# Patient Record
Sex: Female | Born: 1990 | Race: Black or African American | Hispanic: No | Marital: Single | Smoking: Former smoker
Health system: Southern US, Community
[De-identification: ages and names within clinical notes are randomized; demographics above are authoritative.]

## PROBLEM LIST (undated history)

## (undated) DIAGNOSIS — I10 Essential (primary) hypertension: Secondary | ICD-10-CM

## (undated) DIAGNOSIS — J45909 Unspecified asthma, uncomplicated: Secondary | ICD-10-CM

## (undated) HISTORY — DX: Unspecified asthma, uncomplicated: J45.909

---

## 2011-12-15 DIAGNOSIS — I10 Essential (primary) hypertension: Secondary | ICD-10-CM

## 2018-09-06 ENCOUNTER — Other Ambulatory Visit: Payer: Self-pay

## 2018-09-06 ENCOUNTER — Encounter (HOSPITAL_COMMUNITY): Payer: Self-pay | Admitting: Emergency Medicine

## 2018-09-06 ENCOUNTER — Emergency Department (HOSPITAL_COMMUNITY)
Admission: EM | Admit: 2018-09-06 | Discharge: 2018-09-07 | Disposition: A | Discharge status: Home / Self Care | Payer: Self-pay | Attending: Emergency Medicine | Admitting: Emergency Medicine

## 2018-09-06 DIAGNOSIS — Z79899 Other long term (current) drug therapy: Secondary | ICD-10-CM | POA: Insufficient documentation

## 2018-09-06 DIAGNOSIS — F17210 Nicotine dependence, cigarettes, uncomplicated: Secondary | ICD-10-CM | POA: Insufficient documentation

## 2018-09-06 DIAGNOSIS — L282 Other prurigo: Secondary | ICD-10-CM | POA: Insufficient documentation

## 2018-09-06 NOTE — ED Notes (Signed)
Patient states that she is here for her rash under her left breast and is not complaining of chest pain now as she stated in triage.

## 2018-09-06 NOTE — ED Notes (Signed)
EKG completed at 22:05. EKG shown to [facility]

## 2018-09-06 NOTE — ED Triage Notes (Signed)
Patient states that she is having chest pain and numbness to her left arm. Patient states that this started today. Patient also complains of a rash on her left breast.

## 2018-09-06 NOTE — ED Provider Notes (Signed)
[facility]   CSN: [id number] Arrival date & time: 09/06/18  2139   Time seen 11:47 PM  History   Chief Complaint Chief Complaint  Patient presents with  . Chest Pain    numbness left arm    HPI Brandi Mason is a 28 y.o. female.     HPI patient states yesterday she started having some itching on her left breast and states the skin looked normal.  She states later in the day the skin came off while in the shower.  She complains of soreness now in her left breast that extends up into her left shoulder.  She states the area is still very pruritic.  She denies fever.  She states is draining green pus.  She attributed this to her bra however she states her bra is not new and she has not changed detergents.  She is never had this happen before.  During the course of our conversation she relates to me she was admitted in March and she was "prediabetic" however she was treated with insulin in the hospital.  She was discharged without medications and was told to follow-up of her primary care doctor which she has been unable to do due to COVID.  She states she was admitted to [facility] [place].  She denies polyuria but states she has extreme urgency.  She describes lots of polydipsia without weight loss and states actually she has been gaining weight.  She also states she had a miscarriage on August 16.  She states she is G4 P1 AB 3.  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  She describes family history of hypertension and diabetes  [place] of [place]  History reviewed. No pertinent past medical history.  HTN  There are no active problems to display for this patient.   History reviewed. No pertinent surgical history.   OB History   No obstetric history on file.      Home Medications   Labetalol x 5 years   Prior to Admission medications   Medication Sig Start Date End Date Taking? Authorizing Provider   cephALEXin (KEFLEX) 500 MG capsule Take 1 capsule (500 mg total) by mouth 3 (three) times daily. 09/07/18   Devoria AlbeKnapp, Lyrik Buresh, MD    Family History History reviewed. No pertinent family history.  Social History Social History   Tobacco Use  . Smoking status: Current Every Day Smoker    Packs/day: 0.50    Types: Cigarettes  . Smokeless tobacco: Never Used  Substance Use Topics  . Alcohol use: Not Currently  . Drug use: Never     Allergies   Patient has no allergy information on record.   Review of Systems Review of Systems  All other systems reviewed and are negative.    Physical Exam Updated Vital Signs BP (!) 145/76 (BP Location: Right Arm)   Pulse (!) 106   Temp 99.3 F (37.4 C) (Oral)   Resp 20   Ht 5\' 9"  (1.753 m)   Wt 122.5 kg   LMP  (LMP Unknown)   SpO2 99%   BMI 39.87 kg/m   Physical Exam Vitals signs and nursing note reviewed.  Constitutional:      Appearance: Normal appearance. She is obese.  HENT:     Head: Normocephalic and atraumatic.     Right Ear: External ear normal.     Left Ear: External ear normal.  Eyes:  Extraocular Movements: Extraocular movements intact.     Conjunctiva/sclera: Conjunctivae normal.  Neck:     Musculoskeletal: Normal range of motion.  Cardiovascular:     Rate and Rhythm: Normal rate.  Pulmonary:     Effort: Pulmonary effort is normal. No respiratory distress.  Chest:    Skin:    General: Skin is warm and dry.     Comments: When I examine her breasts she has an area in the left breast that is midline and superior and is linear and appears to be a very deep abrasion, she states it is draining pus however I do not see where it has been draining at all.  There is no induration under the skin.  There is minimal redness around it.  Neurological:     General: No focal deficit present.     Mental Status: She is alert and oriented to person, place, and time.     Cranial Nerves: No cranial nerve deficit.  Psychiatric:         Mood and Affect: Mood normal.        Behavior: Behavior normal.        Thought Content: Thought content normal.      ED Treatments / Results  Labs (all labs ordered are listed, but only abnormal results are displayed) Results for orders placed or performed during the hospital encounter of 09/06/18  Urinalysis, Routine w reflex microscopic  Result Value Ref Range   Color, Urine YELLOW YELLOW   APPearance HAZY (A) CLEAR   Specific Gravity, Urine 1.030 1.005 - 1.030   pH 5.0 5.0 - 8.0   Glucose, UA NEGATIVE NEGATIVE mg/dL   Hgb urine dipstick NEGATIVE NEGATIVE   Bilirubin Urine NEGATIVE NEGATIVE   Ketones, ur NEGATIVE NEGATIVE mg/dL   Protein, ur NEGATIVE NEGATIVE mg/dL   Nitrite NEGATIVE NEGATIVE   Leukocytes,Ua NEGATIVE NEGATIVE  CBG monitoring, ED  Result Value Ref Range   Glucose-Capillary 116 (H) 70 - 99 mg/dL   Laboratory interpretation all normal     EKG None  ED ECG REPORT   Date: 09/07/2018  Rate: 98  Rhythm: normal sinus rhythm  QRS Axis: normal  Intervals: normal  ST/T Wave abnormalities: early repolarization  Conduction Disutrbances:none  Narrative Interpretation: baseline wander  Old EKG Reviewed: none available  I have personally reviewed the EKG tracing and agree with the computerized printout as noted.   Radiology No results found.  Procedures Procedures (including critical care time)  Medications Ordered in ED Medications  famotidine (PEPCID) tablet 20 mg (20 mg Oral Given 09/07/18 0055)  diphenhydrAMINE (BENADRYL) capsule 50 mg (50 mg Oral Given 09/07/18 0056)     Initial Impression / Assessment and Plan / ED Course  I have reviewed the triage vital signs and the nursing notes.  Pertinent labs & imaging results that were available during my care of the patient were reviewed by me and considered in my medical decision making (see chart for details).      Concern was that patient was on insulin a several months ago when she  describes polydipsia.  CBG was done and she also complained of urinary urgency.  Urinalysis was done.  Patient blood sugar is normal and her UA does not show any sign of UTI.  She was advised of what she can take over-the-counter for the itching and she was started on Keflex.  She states the area is draining however I do not see any evidence of that.  I do not feel  anything that feels like an abscess in the area.  She should be rechecked if it gets worse.    Final Clinical Impressions(s) / ED Diagnoses   Final diagnoses:  Pruritic rash    ED Discharge Orders         Ordered    cephALEXin (KEFLEX) 500 MG capsule  3 times daily     09/07/18 0120         Plan discharge  Rolland Porter, MD, Barbette Or, MD 09/07/18 [phone]

## 2018-09-07 LAB — URINALYSIS, ROUTINE W REFLEX MICROSCOPIC
Bilirubin Urine: NEGATIVE
Glucose, UA: NEGATIVE mg/dL
Hgb urine dipstick: NEGATIVE
Ketones, ur: NEGATIVE mg/dL
Leukocytes,Ua: NEGATIVE
Nitrite: NEGATIVE
Protein, ur: NEGATIVE mg/dL
Specific Gravity, Urine: 1.03 (ref 1.005–1.030)
pH: 5 (ref 5.0–8.0)

## 2018-09-07 LAB — CBG MONITORING, ED: Glucose-Capillary: 116 mg/dL — ABNORMAL HIGH (ref 70–99)

## 2018-09-07 MED ORDER — DIPHENHYDRAMINE HCL 25 MG PO CAPS
50.0000 mg | ORAL_CAPSULE | Freq: Once | ORAL | Status: AC
  Administered 2018-09-07: 01:00:00 50 mg via ORAL
  Filled 2018-09-07: qty 2

## 2018-09-07 MED ORDER — FAMOTIDINE 20 MG PO TABS
20.0000 mg | ORAL_TABLET | Freq: Once | ORAL | Status: AC
  Administered 2018-09-07: 01:00:00 20 mg via ORAL
  Filled 2018-09-07: qty 1

## 2018-09-07 MED ORDER — CEPHALEXIN 500 MG PO CAPS: 500.0000 mg | ORAL_CAPSULE | Freq: Three times a day (TID) | ORAL | 0 refills | Status: DC

## 2018-09-07 NOTE — Discharge Instructions (Addendum)
Please wash the involved area at least once a day with soap and water.  Take the antibiotic until gone.  You can take Pepcid over-the-counter twice a day for the itching.  You can take Claritin over-the-counter once a day for itching.  Recheck if you get a fever, or the area gets more red or swollen.  Your blood sugar look great today at 116 which is a good blood sugar for someone who has been eating throughout the day.  Your urine did not show any signs of infection.

## 2019-08-26 ENCOUNTER — Encounter (HOSPITAL_COMMUNITY): Payer: Self-pay

## 2019-08-26 ENCOUNTER — Other Ambulatory Visit: Payer: Self-pay

## 2019-08-26 ENCOUNTER — Emergency Department (HOSPITAL_COMMUNITY)
Admission: EM | Admit: 2019-08-26 | Discharge: 2019-08-27 | Disposition: A | Discharge status: Home / Self Care | Payer: Medicaid - Out of State | Attending: Emergency Medicine | Admitting: Emergency Medicine

## 2019-08-26 DIAGNOSIS — Z87891 Personal history of nicotine dependence: Secondary | ICD-10-CM | POA: Insufficient documentation

## 2019-08-26 DIAGNOSIS — N760 Acute vaginitis: Secondary | ICD-10-CM | POA: Insufficient documentation

## 2019-08-26 DIAGNOSIS — I10 Essential (primary) hypertension: Secondary | ICD-10-CM | POA: Insufficient documentation

## 2019-08-26 DIAGNOSIS — B9689 Other specified bacterial agents as the cause of diseases classified elsewhere: Secondary | ICD-10-CM | POA: Diagnosis not present

## 2019-08-26 DIAGNOSIS — B373 Candidiasis of vulva and vagina: Secondary | ICD-10-CM

## 2019-08-26 DIAGNOSIS — B3731 Acute candidiasis of vulva and vagina: Secondary | ICD-10-CM

## 2019-08-26 HISTORY — DX: Essential (primary) hypertension: I10

## 2019-08-26 MED ORDER — FLUCONAZOLE 150 MG PO TABS
150.0000 mg | ORAL_TABLET | Freq: Once | ORAL | Status: AC
  Administered 2019-08-27: 150 mg via ORAL
  Filled 2019-08-26: qty 1

## 2019-08-26 NOTE — ED Triage Notes (Signed)
Pt reports vaginal itching that started today, pt reports vagina is red, irritated, and itches. Pt is currently taking antibiotics.

## 2019-08-26 NOTE — ED Provider Notes (Signed)
[facility] EMERGENCY DEPARTMENT Provider Note   CSN: [id number] Arrival date & time: 08/26/19  2233     History Chief Complaint  Patient presents with  . Vaginal Itching    Brandi Mason is a 29 y.o. female.  Patient presents to the emergency department for evaluation of vaginal itching and irritation.  Symptoms began earlier today.  Patient reports that she has been on an antibiotic for a dental abscess.  She has 7 more days of the antibiotic.  She has had similar symptoms in the past with antibiotics causing vaginal yeast infections.  She has not tried any over-the-counter medications.        Past Medical History:  Diagnosis Date  . Hypertension     There are no problems to display for this patient.   History reviewed. No pertinent surgical history.   OB History   No obstetric history on file.     No family history on file.  Social History   Tobacco Use  . Smoking status: Former Smoker    Types: Cigarettes    Quit date: 08/21/2017    Years since quitting: 2.0  . Smokeless tobacco: Never Used  Vaping Use  . Vaping Use: Never used  Substance Use Topics  . Alcohol use: Not Currently  . Drug use: Yes    Types: Marijuana    Home Medications Prior to Admission medications   Medication Sig Start Date End Date Taking? Authorizing Provider  cephALEXin (KEFLEX) 500 MG capsule Take 1 capsule (500 mg total) by mouth 3 (three) times daily. 09/07/18   Devoria Albe, MD  fluconazole (DIFLUCAN) 150 MG tablet Take 1 tablet (150 mg total) by mouth once for 1 dose. Take after course of antibiotics is finished 08/27/19 08/27/19  Ellamarie Naeve, Canary Brim, MD    Allergies    Patient has no known allergies.  Review of Systems   Review of Systems  Genitourinary: Positive for vaginal discharge.  All other systems reviewed and are negative.   Physical Exam Updated Vital Signs BP 124/90 (BP Location: Right Arm)   Pulse 94   Temp 98.9 F (37.2 C) (Oral)   Resp 18   Ht 5'  9" (1.753 m)   Wt 127 kg   LMP 07/31/2019 (Approximate)   SpO2 100%   BMI 41.35 kg/m   Physical Exam Vitals and nursing note reviewed. Exam conducted with a chaperone present.  Constitutional:      General: She is not in acute distress.    Appearance: Normal appearance. She is well-developed.  HENT:     Head: Normocephalic and atraumatic.     Right Ear: Hearing normal.     Left Ear: Hearing normal.     Nose: Nose normal.  Eyes:     Conjunctiva/sclera: Conjunctivae normal.     Pupils: Pupils are equal, round, and reactive to light.  Cardiovascular:     Rate and Rhythm: Regular rhythm.     Heart sounds: S1 normal and S2 normal. No murmur heard.  No friction rub. No gallop.   Pulmonary:     Effort: Pulmonary effort is normal. No respiratory distress.     Breath sounds: Normal breath sounds.  Chest:     Chest wall: No tenderness.  Abdominal:     General: Bowel sounds are normal.     Palpations: Abdomen is soft.     Tenderness: There is no abdominal tenderness. There is no guarding or rebound. Negative signs include Murphy's sign and McBurney's sign.  Hernia: No hernia is present.  Genitourinary:    General: Normal vulva.     Vagina: Vaginal discharge (white) and erythema present.     Cervix: Normal.     Uterus: Normal.      Adnexa: Right adnexa normal and left adnexa normal.  Musculoskeletal:        General: Normal range of motion.     Cervical back: Normal range of motion and neck supple.  Skin:    General: Skin is warm and dry.     Findings: No rash.  Neurological:     Mental Status: She is alert and oriented to person, place, and time.     GCS: GCS eye subscore is 4. GCS verbal subscore is 5. GCS motor subscore is 6.     Cranial Nerves: No cranial nerve deficit.     Sensory: No sensory deficit.     Coordination: Coordination normal.  Psychiatric:        Speech: Speech normal.        Behavior: Behavior normal.        Thought Content: Thought content normal.      ED Results / Procedures / Treatments   Labs (all labs ordered are listed, but only abnormal results are displayed) Labs Reviewed  WET PREP, GENITAL - Abnormal; Notable for the following components:      Result Value   Yeast Wet Prep HPF POC PRESENT (*)    WBC, Wet Prep HPF POC MODERATE (*)    All other components within normal limits  GC/CHLAMYDIA PROBE AMP ([place]) NOT AT [facility]    EKG None  Radiology No results found.  Procedures Procedures (including critical care time)  Medications Ordered in ED Medications  fluconazole (DIFLUCAN) tablet 150 mg (150 mg Oral Given 08/27/19 0013)    ED Course  I have reviewed the triage vital signs and the nursing notes.  Pertinent labs & imaging results that were available during my care of the patient were reviewed by me and considered in my medical decision making (see chart for details).    MDM Rules/Calculators/A&P                          Patient presents with vaginal irritation and itching, likely secondary to candidiasis from antibiotic usage.  GC and Chlamydia pending.  Will treat empirically with Diflucan.  Final Clinical Impression(s) / ED Diagnoses Final diagnoses:  Yeast vaginitis    Rx / DC Orders ED Discharge Orders         Ordered    fluconazole (DIFLUCAN) 150 MG tablet   Once,   Status:  Discontinued        08/27/19 0011    fluconazole (DIFLUCAN) 150 MG tablet   Once        08/27/19 0013           Gilda Crease, MD 08/27/19 [phone]

## 2019-08-27 LAB — WET PREP, GENITAL
Clue Cells Wet Prep HPF POC: NONE SEEN
Sperm: NONE SEEN
Trich, Wet Prep: NONE SEEN

## 2019-08-27 MED ORDER — FLUCONAZOLE 150 MG PO TABS: 150.0000 mg | ORAL_TABLET | Freq: Once | ORAL | 0 refills | Status: AC

## 2019-08-27 MED ORDER — FLUCONAZOLE 150 MG PO TABS: 150.0000 mg | ORAL_TABLET | Freq: Once | ORAL | 0 refills | Status: DC

## 2019-08-28 LAB — GC/CHLAMYDIA PROBE AMP (~~LOC~~) NOT AT ARMC
Chlamydia: NEGATIVE
Comment: NEGATIVE
Comment: NORMAL
Neisseria Gonorrhea: NEGATIVE

## 2019-11-22 ENCOUNTER — Ambulatory Visit
Admission: EM | Admit: 2019-11-22 | Discharge: 2019-11-22 | Disposition: A | Discharge status: Home / Self Care | Payer: Medicaid Other | Attending: Emergency Medicine | Admitting: Emergency Medicine

## 2019-11-22 ENCOUNTER — Other Ambulatory Visit: Payer: Self-pay

## 2019-11-22 DIAGNOSIS — N898 Other specified noninflammatory disorders of vagina: Secondary | ICD-10-CM

## 2019-11-22 MED ORDER — FLUCONAZOLE 200 MG PO TABS: ORAL_TABLET | ORAL | 0 refills | Status: DC

## 2019-11-22 NOTE — Discharge Instructions (Signed)
We will treat for yeast infection Diflucan prescribed.  Follow up with PCP as needed Return here or go to ER if you have any new or worsening symptoms fever, chills, nausea, vomiting, abdominal or pelvic pain, painful intercourse, vaginal discharge, vaginal bleeding, persistent symptoms despite treatment, etc..Marland Kitchen

## 2019-11-22 NOTE — ED Provider Notes (Signed)
[facility]   [id number] 11/22/19 Arrival Time: 1409   [id number] irritation  SUBJECTIVE:  Brandi Mason is a 29 y.o. female who presents with complains of vaginal irritation x 3 days.  Symptoms began after taking antibiotics.  Denies concern for STDs.  Improved with wiping.  She denies aggravating factors.  She reports similar symptoms in the past with yeast infection.  She denies fever, chills, nausea, vomiting, abdominal or pelvic pain, urinary symptoms, vaginal discharge, vaginal itching, vaginal odor,  dyspareunia, vaginal rashes or lesions.   Currently on menstrual cycle  ROS: As per HPI.  All other pertinent ROS negative.     Past Medical History:  Diagnosis Date  . Hypertension    No past surgical history on file. No Known Allergies No current facility-administered medications on file prior to encounter.   No current outpatient medications on file prior to encounter.    Social History   Socioeconomic History  . Marital status: Married    Spouse name: Not on file  . Number of children: Not on file  . Years of education: Not on file  . Highest education level: Not on file  Occupational History  . Not on file  Tobacco Use  . Smoking status: Former Smoker    Types: Cigarettes    Quit date: 08/21/2017    Years since quitting: 2.2  . Smokeless tobacco: Never Used  Vaping Use  . Vaping Use: Never used  Substance and Sexual Activity  . Alcohol use: Not Currently  . Drug use: Yes    Types: Marijuana  . Sexual activity: Not on file  Other Topics Concern  . Not on file  Social History Narrative  . Not on file   Social Determinants of Health   Financial Resource Strain:   . Difficulty of Paying Living Expenses: Not on file  Food Insecurity:   . Worried About Programme researcher, broadcasting/film/video in the Last Year: Not on file  . Ran Out of Food in the Last Year: Not on file  Transportation Needs:   . Lack of Transportation (Medical): Not on file  . Lack of  Transportation (Non-Medical): Not on file  Physical Activity:   . Days of Exercise per Week: Not on file  . Minutes of Exercise per Session: Not on file  Stress:   . Feeling of Stress : Not on file  Social Connections:   . Frequency of Communication with Friends and Family: Not on file  . Frequency of Social Gatherings with Friends and Family: Not on file  . Attends Religious Services: Not on file  . Active Member of Clubs or Organizations: Not on file  . Attends Banker Meetings: Not on file  . Marital Status: Not on file  Intimate Partner Violence:   . Fear of Current or Ex-Partner: Not on file  . Emotionally Abused: Not on file  . Physically Abused: Not on file  . Sexually Abused: Not on file   No family history on file.  OBJECTIVE:  Vitals:   11/22/19 1503  BP: 133/86  Pulse: 83  Resp: 17  Temp: 98.3 F (36.8 C)  TempSrc: Tympanic  SpO2: 99%     General appearance: Alert, NAD, appears stated age Head: NCAT Lungs: CTA bilaterally without adventitious breath sounds Heart: regular rate and rhythm.   Back: no CVA tenderness Abdomen: soft, non-tender; bowel sounds normal; no guarding GU: deferred Skin: warm and dry Psychological:  Alert and cooperative. Normal mood and affect.  ASSESSMENT &  PLAN:  1. Vaginal irritation     Meds ordered this encounter  Medications  . fluconazole (DIFLUCAN) 200 MG tablet    Sig: Take one dose by mouth, wait 72 hours, and then take second dose by mouth    Dispense:  2 tablet    Refill:  0    Order Specific Question:   Supervising Provider    Answer:   Eustace Moore [[id number]]    Pending: Labs Reviewed - No data to display  We will treat for yeast infection Diflucan prescribed.  Follow up with PCP as needed Return here or go to ER if you have any new or worsening symptoms fever, chills, nausea, vomiting, abdominal or pelvic pain, painful intercourse, vaginal discharge, vaginal bleeding, persistent symptoms  despite treatment, etc...  Reviewed expectations re: course of current medical issues. Questions answered. Outlined signs and symptoms indicating need for more acute intervention. Patient verbalized understanding. After Visit Summary given.       Rennis Harding, PA-C 11/22/19 1531

## 2019-12-24 ENCOUNTER — Other Ambulatory Visit: Payer: Medicaid Other

## 2020-03-28 ENCOUNTER — Ambulatory Visit (HOSPITAL_COMMUNITY): Payer: Medicaid Other | Attending: Internal Medicine

## 2020-04-08 ENCOUNTER — Inpatient Hospital Stay (HOSPITAL_COMMUNITY): Admission: RE | Admit: 2020-04-08 | Payer: Medicaid Other | Source: Ambulatory Visit

## 2020-04-30 ENCOUNTER — Encounter: Payer: Self-pay | Admitting: Emergency Medicine

## 2020-04-30 ENCOUNTER — Ambulatory Visit
Admission: EM | Admit: 2020-04-30 | Discharge: 2020-04-30 | Disposition: A | Discharge status: Home / Self Care | Payer: Medicaid Other | Attending: Family Medicine | Admitting: Family Medicine

## 2020-04-30 ENCOUNTER — Other Ambulatory Visit: Payer: Self-pay

## 2020-04-30 DIAGNOSIS — N760 Acute vaginitis: Secondary | ICD-10-CM

## 2020-04-30 LAB — POCT URINALYSIS DIP (MANUAL ENTRY)
Bilirubin, UA: NEGATIVE
Glucose, UA: NEGATIVE mg/dL
Ketones, POC UA: NEGATIVE mg/dL
Leukocytes, UA: NEGATIVE
Nitrite, UA: NEGATIVE
Protein Ur, POC: NEGATIVE mg/dL
Spec Grav, UA: >=1.03 — AB (ref 1.010–1.025)
Urobilinogen, UA: 0.2 E.U./dL
pH, UA: 6 (ref 5.0–8.0)

## 2020-04-30 MED ORDER — FLUCONAZOLE 150 MG PO TABS: ORAL_TABLET | ORAL | 0 refills | Status: DC

## 2020-04-30 NOTE — ED Triage Notes (Signed)
Urinary tract infection symptoms, urinary frequency and smell.

## 2020-04-30 NOTE — Discharge Instructions (Addendum)
I have sent in fluconazole in case of yeast. Take one tablet at the onset of symptoms. If symptoms are still present in 3 days, take the second tablet.   Follow up with this office or with primary care if symptoms are persisting.  Follow up in the ER for high fever, trouble swallowing, trouble breathing, other concerning symptoms.    

## 2020-05-06 NOTE — ED Provider Notes (Signed)
[facility]    CSN: [id number] Arrival date & time: 04/30/20  1325      History   Chief Complaint Chief Complaint  Patient presents with  . Urinary Tract Infection    HPI Brandi Mason is a 30 y.o. female.   Reports that she has been experiencing malodorous urine, dysuria, frequency, urgency for the last 2 days.  Has not attempted OTC treatment.  States that she has history of UTI.  States that she is also been on antibiotics for sinus infection as well as for BV recently.  States that she has had a yeast infection in the past.  Denies vaginal discharge, dyspareunia, vaginal odor, other symptoms.  ROS per HPI  The history is provided by the patient.  Urinary Tract Infection   Past Medical History:  Diagnosis Date  . Hypertension     There are no problems to display for this patient.   History reviewed. No pertinent surgical history.  OB History   No obstetric history on file.      Home Medications    Prior to Admission medications   Medication Sig Start Date End Date Taking? Authorizing Provider  fluconazole (DIFLUCAN) 150 MG tablet Take one tablet at the onset of symptoms. If symptoms are still present 3 days later, take the second tablet. 04/30/20  Yes Moshe Cipro, NP  LABETALOL HCL PO Take by mouth.    [provider]    Family History No family history on file.  Social History Social History   Tobacco Use  . Smoking status: Former Smoker    Types: Cigarettes    Quit date: 08/21/2017    Years since quitting: 2.7  . Smokeless tobacco: Never Used  Vaping Use  . Vaping Use: Never used  Substance Use Topics  . Alcohol use: Not Currently  . Drug use: Yes    Types: Marijuana     Allergies   Patient has no known allergies.   Review of Systems Review of Systems   Physical Exam Triage Vital Signs ED Triage Vitals  Enc Vitals Group     BP 04/30/20 1346 132/84     Pulse Rate 04/30/20 1346 95     Resp 04/30/20 1346  18     Temp 04/30/20 1346 98.7 F (37.1 C)     Temp Source 04/30/20 1346 Oral     SpO2 04/30/20 1346 97 %     Weight 04/30/20 1358 288 lb (130.6 kg)     Height --      Head Circumference --      Peak Flow --      Pain Score 04/30/20 1358 4     Pain Loc --      Pain Edu? --      Excl. in GC? --    No data found.  Updated Vital Signs BP 132/84   Pulse 95   Temp 98.7 F (37.1 C) (Oral)   Resp 18   Wt 288 lb (130.6 kg)   LMP 02/26/2020   SpO2 97%   BMI 42.53 kg/m   Visual Acuity Right Eye Distance:   Left Eye Distance:   Bilateral Distance:    Right Eye Near:   Left Eye Near:    Bilateral Near:     Physical Exam Vitals and nursing note reviewed.  Constitutional:      General: She is not in acute distress.    Appearance: She is well-developed. She is obese. She is not ill-appearing.  HENT:     Head: Normocephalic and atraumatic.     Nose: Nose normal.     Mouth/Throat:     Mouth: Mucous membranes are moist.     Pharynx: Oropharynx is clear.  Eyes:     Extraocular Movements: Extraocular movements intact.     Conjunctiva/sclera: Conjunctivae normal.     Pupils: Pupils are equal, round, and reactive to light.  Cardiovascular:     Rate and Rhythm: Normal rate and regular rhythm.     Heart sounds: No murmur heard.   Pulmonary:     Effort: Pulmonary effort is normal. No respiratory distress.     Breath sounds: Normal breath sounds.  Abdominal:     Palpations: Abdomen is soft.     Tenderness: There is no abdominal tenderness.  Genitourinary:    Comments: Deferred Musculoskeletal:        General: Normal range of motion.     Cervical back: Normal range of motion and neck supple.  Skin:    General: Skin is warm and dry.     Capillary Refill: Capillary refill takes less than 2 seconds.  Neurological:     General: No focal deficit present.     Mental Status: She is alert and oriented to person, place, and time.  Psychiatric:        Mood and Affect: Mood  normal.        Behavior: Behavior normal.        Thought Content: Thought content normal.      UC Treatments / Results  Labs (all labs ordered are listed, but only abnormal results are displayed) Labs Reviewed  POCT URINALYSIS DIP (MANUAL ENTRY) - Abnormal; Notable for the following components:      Result Value   Clarity, UA hazy (*)    Spec Grav, UA >=1.030 (*)    Blood, UA trace-intact (*)    All other components within normal limits    EKG   Radiology No results found.  Procedures Procedures (including critical care time)  Medications Ordered in UC Medications - No data to display  Initial Impression / Assessment and Plan / UC Course  I have reviewed the triage vital signs and the nursing notes.  Pertinent labs & imaging results that were available during my care of the patient were reviewed by me and considered in my medical decision making (see chart for details).    Acute vaginitis  UA unremarkable for infection Most likely yeast vaginitis given recent antibiotic use Fluconazole prescribed Follow-up with this office or primary care as needed Follow-up in the ER for acute worsening symptoms.  Final Clinical Impressions(s) / UC Diagnoses   Final diagnoses:  Acute vaginitis     Discharge Instructions     I have sent in fluconazole in case of yeast. Take one tablet at the onset of symptoms. If symptoms are still present in 3 days, take the second tablet.   Follow up with this office or with primary care if symptoms are persisting.  Follow up in the ER for high fever, trouble swallowing, trouble breathing, other concerning symptoms.      ED Prescriptions    Medication Sig Dispense Auth. Provider   fluconazole (DIFLUCAN) 150 MG tablet Take one tablet at the onset of symptoms. If symptoms are still present 3 days later, take the second tablet. 2 tablet Moshe Cipro, NP     PDMP not reviewed this encounter.   Moshe Cipro,  NP 05/06/20 1038

## 2020-05-11 ENCOUNTER — Ambulatory Visit (INDEPENDENT_AMBULATORY_CARE_PROVIDER_SITE_OTHER): Payer: Medicaid Other | Admitting: Adult Health

## 2020-05-11 ENCOUNTER — Other Ambulatory Visit: Payer: Self-pay

## 2020-05-11 ENCOUNTER — Other Ambulatory Visit (HOSPITAL_COMMUNITY)
Admission: RE | Admit: 2020-05-11 | Discharge: 2020-05-11 | Disposition: A | Discharge status: Home / Self Care | Payer: Medicaid Other | Source: Ambulatory Visit | Attending: Adult Health | Admitting: Adult Health

## 2020-05-11 ENCOUNTER — Encounter: Payer: Self-pay | Admitting: Adult Health

## 2020-05-11 VITALS — BP 119/68 | HR 110 | Ht 68.25 in | Wt 302.0 lb

## 2020-05-11 DIAGNOSIS — Z0000 Encounter for general adult medical examination without abnormal findings: Secondary | ICD-10-CM | POA: Diagnosis present

## 2020-05-11 DIAGNOSIS — N926 Irregular menstruation, unspecified: Secondary | ICD-10-CM

## 2020-05-11 DIAGNOSIS — R350 Frequency of micturition: Secondary | ICD-10-CM

## 2020-05-11 DIAGNOSIS — Z3202 Encounter for pregnancy test, result negative: Secondary | ICD-10-CM | POA: Diagnosis not present

## 2020-05-11 DIAGNOSIS — R8290 Unspecified abnormal findings in urine: Secondary | ICD-10-CM | POA: Diagnosis not present

## 2020-05-11 LAB — POCT URINALYSIS DIPSTICK
Blood, UA: NEGATIVE
Glucose, UA: NEGATIVE
Ketones, UA: NEGATIVE
Leukocytes, UA: NEGATIVE
Nitrite, UA: NEGATIVE
Protein, UA: NEGATIVE

## 2020-05-11 LAB — POCT URINE PREGNANCY: Preg Test, Ur: NEGATIVE

## 2020-05-11 NOTE — Progress Notes (Signed)
Patient ID: Brandi Mason, female   DOB: 25-Jul-1990, 30 y.o.   MRN: [id number] History of Present Illness: Brandi Mason is a 30 year old black female, married, [id number], in for new pt, well woman gyn exam and pap. Was seen at Urgent Care and treated for yeast, had urinary frequency and odor. She says she has lost 20 lbs since February when seen at hospital in [place].  PCP is Dr Felecia Shelling.   Current Medications, Allergies, Past Medical History, Past Surgical History, Family History and Social History were reviewed in [employer] record.     Review of Systems:  Patient denies any headaches, hearing loss, fatigue, blurred vision, shortness of breath, chest pain, abdominal pain, problems with bowel movements(goes daily but looks like pebbles, needs to increase fiber and water), urination(had frequency), or intercourse. No joint pain or mood swings. Has missed a period and periods are shorter and more irregular. Has had breast tenderness.   Physical Exam:BP 119/68 (BP Location: Left Arm, Patient Position: Sitting, Cuff Size: Large)   Pulse (!) 110   Ht 5' 8.25" (1.734 m)   Wt (!) 302 lb (137 kg)   LMP 03/23/2020   BMI 45.58 kg/m UPT is negative, urine dipstick is negative. General:  Well developed, well nourished, no acute distress Skin:  Warm and dry Neck:  Midline trachea, normal thyroid, good ROM, no lymphadenopathy Lungs; Clear to auscultation bilaterally Breast:  No dominant palpable mass, retraction, or nipple discharge Cardiovascular: Regular rate and rhythm Abdomen:  Soft, non tender, no hepatosplenomegaly Pelvic:  External genitalia is normal in appearance, no lesions.  The vagina is normal in appearance,white discharge, without odor. Urethra has no lesions or masses. The cervix is bulbous,pap with GC/CHL and HR HPV genotyping performed.  Uterus is felt to be normal size, shape, and contour.  No adnexal masses or tenderness noted.Bladder is non tender, no masses  felt. Extremities/musculoskeletal:  No swelling or varicosities noted, no clubbing or cyanosis Psych:  No mood changes, alert and cooperative,seems happy AA is 0 Fall risk is low PHQ 9 score is 10, she denies depressed just feels annoyed GAD 7 score is 3  Upstream - 05/11/20 1004      Pregnancy Intention Screening   Does the patient want to become pregnant in the next year? Yes    Does the patient's partner want to become pregnant in the next year? Yes    Would the patient like to discuss contraceptive options today? No      Contraception Wrap Up   Current Method Pregnant/Seeking Pregnancy    End Method Pregnant/Seeking Pregnancy    Contraception Counseling Provided No         Examination chaperoned by Malachy Mood LPN.   Impression and Plan:  1. Routine general medical examination at a health care facility Pap sent Physical in 1 year Pap in 3 if normal Labs with PCP Mammogram at 41 Colonoscopy at 46  2. Urinary frequency  3. Abnormal urine odor Increase water  4. Missed period Call if misses over 3 months She declines PNV  5. Irregular periods

## 2020-05-13 LAB — CYTOLOGY - PAP
Adequacy: ABSENT
Chlamydia: NEGATIVE
Comment: NEGATIVE
Comment: NEGATIVE
Comment: NORMAL
Diagnosis: NEGATIVE
High risk HPV: NEGATIVE
Neisseria Gonorrhea: NEGATIVE

## 2020-05-20 ENCOUNTER — Other Ambulatory Visit: Payer: Self-pay

## 2020-05-20 ENCOUNTER — Encounter (HOSPITAL_COMMUNITY): Payer: Self-pay | Admitting: *Deleted

## 2020-05-20 ENCOUNTER — Emergency Department (HOSPITAL_COMMUNITY): Payer: Medicaid Other

## 2020-05-20 ENCOUNTER — Ambulatory Visit
Admission: EM | Admit: 2020-05-20 | Discharge: 2020-05-20 | Disposition: A | Discharge status: Home / Self Care | Payer: Medicaid Other

## 2020-05-20 ENCOUNTER — Emergency Department (HOSPITAL_COMMUNITY)
Admission: EM | Admit: 2020-05-20 | Discharge: 2020-05-20 | Disposition: A | Discharge status: Home / Self Care | Payer: Medicaid Other | Attending: Emergency Medicine | Admitting: Emergency Medicine

## 2020-05-20 ENCOUNTER — Encounter: Payer: Self-pay | Admitting: Emergency Medicine

## 2020-05-20 DIAGNOSIS — Z79899 Other long term (current) drug therapy: Secondary | ICD-10-CM | POA: Insufficient documentation

## 2020-05-20 DIAGNOSIS — R110 Nausea: Secondary | ICD-10-CM | POA: Insufficient documentation

## 2020-05-20 DIAGNOSIS — R072 Precordial pain: Secondary | ICD-10-CM | POA: Diagnosis not present

## 2020-05-20 DIAGNOSIS — R079 Chest pain, unspecified: Secondary | ICD-10-CM

## 2020-05-20 DIAGNOSIS — R519 Headache, unspecified: Secondary | ICD-10-CM | POA: Insufficient documentation

## 2020-05-20 DIAGNOSIS — H53149 Visual discomfort, unspecified: Secondary | ICD-10-CM | POA: Diagnosis not present

## 2020-05-20 DIAGNOSIS — I10 Essential (primary) hypertension: Secondary | ICD-10-CM | POA: Diagnosis not present

## 2020-05-20 DIAGNOSIS — R0789 Other chest pain: Secondary | ICD-10-CM | POA: Insufficient documentation

## 2020-05-20 DIAGNOSIS — R0602 Shortness of breath: Secondary | ICD-10-CM | POA: Diagnosis not present

## 2020-05-20 DIAGNOSIS — R61 Generalized hyperhidrosis: Secondary | ICD-10-CM | POA: Diagnosis not present

## 2020-05-20 DIAGNOSIS — Z87891 Personal history of nicotine dependence: Secondary | ICD-10-CM | POA: Insufficient documentation

## 2020-05-20 LAB — CBC
HCT: 40.8 % (ref 36.0–46.0)
Hemoglobin: 13.1 g/dL (ref 12.0–15.0)
MCH: 28 pg (ref 26.0–34.0)
MCHC: 32.1 g/dL (ref 30.0–36.0)
MCV: 87.2 fL (ref 80.0–100.0)
Platelets: 233 10*3/uL (ref 150–400)
RBC: 4.68 MIL/uL (ref 3.87–5.11)
RDW: 14.6 % (ref 11.5–15.5)
WBC: 6.9 10*3/uL (ref 4.0–10.5)
nRBC: 0 % (ref 0.0–0.2)

## 2020-05-20 LAB — BASIC METABOLIC PANEL
Anion gap: 6 (ref 5–15)
BUN: 12 mg/dL (ref 6–20)
CO2: 25 mmol/L (ref 22–32)
Calcium: 8.7 mg/dL — ABNORMAL LOW (ref 8.9–10.3)
Chloride: 107 mmol/L (ref 98–111)
Creatinine, Ser: 0.74 mg/dL (ref 0.44–1.00)
GFR, Estimated: >60 mL/min (ref >60)
Glucose, Bld: 91 mg/dL (ref 70–99)
Potassium: 4 mmol/L (ref 3.5–5.1)
Sodium: 138 mmol/L (ref 135–145)

## 2020-05-20 LAB — TROPONIN I (HIGH SENSITIVITY)
Troponin I (High Sensitivity): <2 ng/L (ref <18)
Troponin I (High Sensitivity): <2 ng/L (ref <18)

## 2020-05-20 LAB — PREGNANCY, URINE: Preg Test, Ur: NEGATIVE

## 2020-05-20 MED ORDER — PROCHLORPERAZINE MALEATE 5 MG PO TABS
10.0000 mg | ORAL_TABLET | Freq: Once | ORAL | Status: AC
  Administered 2020-05-20: 10 mg via ORAL
  Filled 2020-05-20: qty 2

## 2020-05-20 MED ORDER — DIPHENHYDRAMINE HCL 25 MG PO CAPS
25.0000 mg | ORAL_CAPSULE | Freq: Once | ORAL | Status: AC
  Administered 2020-05-20: 25 mg via ORAL
  Filled 2020-05-20: qty 1

## 2020-05-20 MED ORDER — CYCLOBENZAPRINE HCL 10 MG PO TABS
10.0000 mg | ORAL_TABLET | Freq: Once | ORAL | Status: AC
  Administered 2020-05-20: 10 mg via ORAL
  Filled 2020-05-20: qty 1

## 2020-05-20 MED ORDER — METOCLOPRAMIDE HCL 5 MG/ML IJ SOLN
10.0000 mg | Freq: Once | INTRAMUSCULAR | Status: AC
  Administered 2020-05-20: 10 mg via INTRAVENOUS
  Filled 2020-05-20: qty 2

## 2020-05-20 MED ORDER — SODIUM CHLORIDE 0.9 % IV BOLUS
500.0000 mL | Freq: Once | INTRAVENOUS | Status: AC
  Administered 2020-05-20: 500 mL via INTRAVENOUS

## 2020-05-20 NOTE — ED Notes (Signed)
Patient is being discharged from the Urgent Care and sent to the Emergency Department via ems . Per B. Wurst, patient is in need of higher level of care due to cp. Patient is aware and verbalizes understanding of plan of care.  Vitals:   05/20/20 1127  BP: 123/82  Pulse: 83  Resp: 19  Temp: 98.2 F (36.8 C)  SpO2: 95%

## 2020-05-20 NOTE — ED Triage Notes (Signed)
Chest pain with c/o headache onset last night

## 2020-05-20 NOTE — ED Triage Notes (Addendum)
Tightness feeling in  LT chest w/ intermittent sharp pains that  started last night.  Also having a headache x 1 week.

## 2020-05-20 NOTE — Discharge Instructions (Addendum)
Your exam and vital signs look reassuring.  I suspect you are suffering from a migraine.  I recommend over-the-counter pain medications, also recommend decreasing stress, exercising, eating healthy as this can decrease migraines.  Please follow-up with neurology for further evaluation.  I suspect your chest pain is from a muscular strain, I recommend over the counter pain medications, please follow-up with your PCP for further evaluation.  Come back to the emergency department if you develop chest pain, shortness of breath, severe abdominal pain, uncontrolled nausea, vomiting, diarrhea.

## 2020-05-20 NOTE — ED Provider Notes (Signed)
[facility] EMERGENCY DEPARTMENT Provider Note   CSN: [id number] Arrival date & time: 05/20/20  1214     History Chief Complaint  Patient presents with  . Chest Pain    Brandi Mason is a 30 y.o. female.  HPI   Patient with significant medical history of hypertension presents to the emergency department with chief complaint of chest pain and a headache.  Patient states this started suddenly last night, started as chest pain which was substernal and then radiated to her left shoulder, she has no associated shortness of breath, becoming diaphoretic, states she became nauseous but denies vomiting, denies exertion make the pain worse.  She does endorse that laying down tends to make her pain worse.  She then developed a headache, describes it as a throbbing sensation on her left side, no visual changes, she does endorse photophobia, increased sensitivity to noise, denies paresthesia or weakness in the upper lower extremities.  She denies recent head trauma, is not on anticoagulant, denies associated fevers or chills.  Patient states she has a history of migraines, states this feels slightly worse than normal.  She denies recent sick contacts, is up-to-date on her COVID and her influenza vaccines.  Patient denies alleviating factors.  Patient denies fevers, chills, shortness of breath, abdominal pain, vomiting, diarrhea, worsening pedal edema.  Past Medical History:  Diagnosis Date  . Hypertension     There are no problems to display for this patient.   History reviewed. No pertinent surgical history.   OB History    Gravida  5   Para  1   Term  1   Preterm      AB  4   Living  1     SAB  2   IAB      Ectopic      Multiple      Live Births  1           Family History  Problem Relation Age of Onset  . Diabetes Paternal Grandfather   . Stroke Maternal Grandmother   . Other Maternal Grandfather        Covid  . Congestive Heart Failure Father   . HIV Mother    . Cancer Mother   . Ovarian cancer Mother 19  . Polycystic ovary syndrome Sister   . Asthma Daughter     Social History   Tobacco Use  . Smoking status: Former Smoker    Types: Cigarettes    Quit date: 08/21/2017    Years since quitting: 2.7  . Smokeless tobacco: Never Used  Vaping Use  . Vaping Use: Never used  Substance Use Topics  . Alcohol use: Not Currently  . Drug use: Yes    Types: Marijuana    Comment: "sometimes" 2 x a week     Home Medications Prior to Admission medications   Medication Sig Start Date End Date Taking? Authorizing Provider  labetalol (NORMODYNE) 100 MG tablet Take by mouth.    [provider]    Allergies    Shellfish-derived products  Review of Systems   Review of Systems  Constitutional: Negative for chills and fever.  HENT: Negative for congestion and sore throat.   Eyes: Positive for photophobia. Negative for visual disturbance.  Respiratory: Negative for shortness of breath.   Cardiovascular: Positive for chest pain.  Gastrointestinal: Positive for nausea. Negative for abdominal pain, diarrhea and vomiting.  Genitourinary: Negative for enuresis and flank pain.  Musculoskeletal: Negative for back pain.  Skin: Negative for rash.  Neurological: Positive for headaches. Negative for dizziness and weakness.  Hematological: Does not bruise/bleed easily.    Physical Exam Updated Vital Signs BP 106/63   Pulse 79   Temp 98.4 F (36.9 C) (Oral)   Resp 10   Ht 5\' 9"  (1.753 m)   Wt 127.9 kg   SpO2 100%   BMI 41.64 kg/m   Physical Exam Vitals and nursing note reviewed.  Constitutional:      General: She is not in acute distress.    Appearance: She is not ill-appearing.  HENT:     Head: Normocephalic and atraumatic.     Right Ear: Tympanic membrane, ear canal and external ear normal.     Left Ear: Tympanic membrane, ear canal and external ear normal.     Nose: No congestion.     Mouth/Throat:     Mouth: Mucous membranes  are moist.     Pharynx: Oropharynx is clear. No oropharyngeal exudate or posterior oropharyngeal erythema.  Eyes:     Extraocular Movements: Extraocular movements intact.     Conjunctiva/sclera: Conjunctivae normal.     Pupils: Pupils are equal, round, and reactive to light.  Cardiovascular:     Rate and Rhythm: Normal rate and regular rhythm.     Pulses: Normal pulses.     Heart sounds: No murmur heard. No friction rub. No gallop.      Comments: Chest pain was reproducible, she is notably tender on the lateral upper aspect of the pectoral muscle.  Pulmonary:     Effort: No respiratory distress.     Breath sounds: No wheezing, rhonchi or rales.  Abdominal:     Palpations: Abdomen is soft.     Tenderness: There is no abdominal tenderness.  Musculoskeletal:     Cervical back: No rigidity.     Comments: Patient has 5 5 strength in the upper lower extremities.  Skin:    General: Skin is warm and dry.  Neurological:     Mental Status: She is alert.     GCS: GCS eye subscore is 4. GCS verbal subscore is 5. GCS motor subscore is 6.     Motor: No weakness.     Coordination: Romberg sign negative. Finger-Nose-Finger Test normal.     Comments: Current nerves II through XII are grossly intact.  Patient is have no difficulty with word finding.  Psychiatric:        Mood and Affect: Mood normal.     ED Results / Procedures / Treatments   Labs (all labs ordered are listed, but only abnormal results are displayed) Labs Reviewed  BASIC METABOLIC PANEL - Abnormal; Notable for the following components:      Result Value   Calcium 8.7 (*)    All other components within normal limits  CBC  PREGNANCY, URINE  TROPONIN I (HIGH SENSITIVITY)  TROPONIN I (HIGH SENSITIVITY)    EKG None  Radiology DG Chest 2 View  Result Date: 05/20/2020 CLINICAL DATA:  30 year old female with chest pain. EXAM: CHEST - 2 VIEW COMPARISON:  None. FINDINGS: The mediastinal contours are within normal limits.  No cardiomegaly. The lungs are clear bilaterally without evidence of focal consolidation, pleural effusion, or pneumothorax. No acute osseous abnormality. IMPRESSION: No acute cardiopulmonary process. Electronically Signed   By: Marliss Cootsylan  Suttle MD   On: 05/20/2020 13:12   CT Head Wo Contrast  Result Date: 05/20/2020 CLINICAL DATA:  Headache EXAM: CT HEAD WITHOUT CONTRAST TECHNIQUE: Contiguous axial images were  obtained from the base of the skull through the vertex without intravenous contrast. COMPARISON:  None. FINDINGS: Brain: No evidence of acute infarction, hemorrhage, hydrocephalus, extra-axial collection or mass lesion/mass effect. Multifocal dural calcifications. Vascular: No hyperdense vessel or unexpected calcification. Skull: Normal. Negative for fracture or focal lesion. Sinuses/Orbits: No acute finding. Other: None. IMPRESSION: Negative head CT. Electronically Signed   By: Malachy Moan M.D.   On: 05/20/2020 15:42    Procedures Procedures   Medications Ordered in ED Medications  prochlorperazine (COMPAZINE) tablet 10 mg (10 mg Oral Given 05/20/20 1437)  diphenhydrAMINE (BENADRYL) capsule 25 mg (25 mg Oral Given 05/20/20 1437)  cyclobenzaprine (FLEXERIL) tablet 10 mg (10 mg Oral Given 05/20/20 1437)  metoCLOPramide (REGLAN) injection 10 mg (10 mg Intravenous Given 05/20/20 1605)  sodium chloride 0.9 % bolus 500 mL (500 mLs Intravenous New Bag/Given 05/20/20 1605)    ED Course  I have reviewed the triage vital signs and the nursing notes.  Pertinent labs & imaging results that were available during my care of the patient were reviewed by me and considered in my medical decision making (see chart for details).    MDM Rules/Calculators/A&P                         Initial impression-patient presents with left-sided chest pain as well as a headache.  She is alert, does not appear in acute distress, vital signs reassuring.  Suspect patient suffering from a migraine with a muscular  strain.  Will obtain chest pain work-up, provide patient with a migraine cocktail, reassess.  Work-up-CBC unremarkable, CMP unremarkable, per troponin is less than 2, second troponin less than 2.  Chest x-ray negative for acute findings.  EKG sinus without signs of ischemia CT head negative for acute findings.  Reassessment patient was reassessed after migraine cocktail, she states she is feeling better but still has a slight headache, concern for possible head mass versus acute bleed as she has headache with new features we will order CT head without contrast for further evaluation.  Patient was reassessed after fluids and Reglan, states she is feeling much better, has no complaints this time.  Patient is agreeable for discharge.  Rule out-I have low suspicion for intracranial head bleed and/or mass as there is no neurodeficits on my exam CT head is negative for acute findings, patient feels better after migraine cocktail.  I have low suspicion for ACS as history is atypical, patient has no cardiac history, EKG was sinus rhythm without signs of ischemia, patient had a delta troponin.  Low suspicion for PE as patient denies pleuritic chest pain, shortness of breath, patient denies leg pain, no pedal edema noted on exam, patient was PERC negative.  Low suspicion for AAA or aortic dissection as history is atypical, patient has low risk factors.  Low suspicion for systemic infection as patient is nontoxic-appearing, vital signs reassuring, no obvious source infection noted on exam.   Plan-  1.  Chest pain since resolved-suspect secondary due to muscular strain is worse with movement and reproducible.  Differential diagnosis includes costochondritis, acid reflux, esophagus spasms, cervical angina, will recommend over-the-counter pain medications, follow-up with PCP for further evaluation.  2.  Headache since resolved-suspect patient is having from a migraine, will recommend over-the-counter pain  medications, follow-up neurology for further evaluation.  Vital signs have remained stable, no indication for hospital admission.  Patient given at home care as well strict return precautions.  Patient verbalized that they  understood agreed to said plan.   Final Clinical Impression(s) / ED Diagnoses Final diagnoses:  Atypical chest pain  Bad headache    Rx / DC Orders ED Discharge Orders    None       Carroll Sage, PA-C 05/20/20 1655    Blane Ohara, MD 05/21/20 [phone]

## 2020-09-07 ENCOUNTER — Encounter: Payer: Self-pay | Admitting: Advanced Practice Midwife

## 2020-09-07 ENCOUNTER — Other Ambulatory Visit: Payer: Self-pay

## 2020-09-07 ENCOUNTER — Other Ambulatory Visit (INDEPENDENT_AMBULATORY_CARE_PROVIDER_SITE_OTHER): Payer: Medicaid Other

## 2020-09-07 ENCOUNTER — Ambulatory Visit (INDEPENDENT_AMBULATORY_CARE_PROVIDER_SITE_OTHER): Payer: Medicaid Other | Admitting: Advanced Practice Midwife

## 2020-09-07 VITALS — BP 126/79 | HR 97 | Ht 69.0 in

## 2020-09-07 DIAGNOSIS — R109 Unspecified abdominal pain: Secondary | ICD-10-CM | POA: Diagnosis not present

## 2020-09-07 DIAGNOSIS — Z3201 Encounter for pregnancy test, result positive: Secondary | ICD-10-CM | POA: Diagnosis not present

## 2020-09-07 DIAGNOSIS — Z3490 Encounter for supervision of normal pregnancy, unspecified, unspecified trimester: Secondary | ICD-10-CM | POA: Diagnosis not present

## 2020-09-07 DIAGNOSIS — N926 Irregular menstruation, unspecified: Secondary | ICD-10-CM

## 2020-09-07 DIAGNOSIS — Z3A01 Less than 8 weeks gestation of pregnancy: Secondary | ICD-10-CM | POA: Diagnosis not present

## 2020-09-07 LAB — POCT URINE PREGNANCY: Preg Test, Ur: POSITIVE — AB

## 2020-09-07 NOTE — Progress Notes (Signed)
[place] 6+[redacted] wks gestational sac with yolk sac,GS 13.8 mm,normal ovaries,moderate amount of simple cul de sac fluid,pt will come back in 10 days for f/u ultrasound per Philipp Deputy

## 2020-09-07 NOTE — Progress Notes (Signed)
   GYN VISIT Patient name: Brandi Mason MRN [id number]  Date of birth: 1990-08-30 Chief Complaint:   Abdominal Pain (More like pressure, Period 2 months late, has not taken a pregnancy test)  History of Present Illness:   Brandi Mason is a 30 y.o. [id number] African-American female being seen today for abd discomfort and dysuria. Had one day of brown spotting on 08/01/20.   Patient's last menstrual period was 07/02/2020. The current method of family planning is none.  Last pap May 2022. Results were: NILM w/ HRHPV negative  Depression screen [facility] 2/9 05/11/2020  Decreased Interest 1  Down, Depressed, Hopeless 0  PHQ - 2 Score 1  Altered sleeping 3  Tired, decreased energy 3  Change in appetite 3  Feeling bad or failure about yourself  0  Trouble concentrating 0  Moving slowly or fidgety/restless 0  Suicidal thoughts 0  PHQ-9 Score 10     GAD 7 : Generalized Anxiety Score 05/11/2020  Nervous, Anxious, on Edge 0  Control/stop worrying 0  Worry too much - different things 1  Trouble relaxing 1  Restless 0  Easily annoyed or irritable 1  Afraid - awful might happen 0  Total GAD 7 Score 3     Review of Systems:   Pertinent items are noted in HPI Denies fever/chills, dizziness, headaches, visual disturbances, fatigue, shortness of breath, chest pain, abdominal pain, vomiting, abnormal vaginal discharge/itching/odor/irritation, problems with periods, bowel movements, urination, or intercourse unless otherwise stated above.  Pertinent History Reviewed:  Reviewed past medical,surgical, social, obstetrical and family history.  Reviewed problem list, medications and allergies. Physical Assessment:   Vitals:   09/07/20 1010  BP: 126/79  Pulse: 97  Height: 5\' 9"  (1.753 m)  Body mass index is 41.64 kg/m.       Physical Examination:   General appearance: alert, well appearing, and in no distress  Mental status: alert, oriented to person, place, and time  Skin: warm & dry    Cardiovascular: normal heart rate noted  Respiratory: normal respiratory effort, no distress  Abdomen: soft, non-tender   Pelvic: examination not indicated  Extremities: no edema   U/S today: 6+[redacted] wks gestational sac with yolk sac,GS 13.8 mm,normal ovaries,moderate amount of simple cul de sac fluid,pt will come back in 10 days for f/u ultrasound per [place]   Results for orders placed or performed in visit on 09/07/20 (from the past 24 hour(s))  POCT urine pregnancy   Collection Time: 09/07/20 10:20 AM  Result Value Ref Range   Preg Test, Ur Positive (A) Negative    Assessment & Plan:  1) Early preg> will get f/u u/s in 10d  2) Dysuria> will get urine culture and GC/chlam screening  Meds: No orders of the defined types were placed in this encounter.   Orders Placed This Encounter  Procedures   Urine Culture   GC/Chlamydia Probe Amp   09/09/20 OB Comp Less 14 Wks   US OB Transvaginal   POCT urine pregnancy    Return for f/u u/s for viability in 10d.  [place] CNM 09/07/2020 1:08 PM

## 2020-09-08 ENCOUNTER — Telehealth: Payer: Self-pay

## 2020-09-08 NOTE — Telephone Encounter (Signed)
Pt called asking if she could take Pepto Bismol because she felt so badly and nauseous. Pt stated that she was just seen in the office and found out she was pregnant. Pt instructed not to take the Pepto Bismol, but given the names of other OTC meds to try, including Vitamin B6 and Unisom. Told pt Joellyn Haff would be notified and call something in for her. Pt confirmed understanding.

## 2020-09-09 ENCOUNTER — Other Ambulatory Visit: Payer: Self-pay | Admitting: Women's Health

## 2020-09-09 MED ORDER — DOXYLAMINE-PYRIDOXINE 10-10 MG PO TBEC: DELAYED_RELEASE_TABLET | ORAL | 6 refills | Status: AC

## 2020-09-09 NOTE — Telephone Encounter (Signed)
Called pt to inform her of prescription per Joellyn Haff. Two identifiers used. Pt confirmed understanding.

## 2020-09-10 LAB — URINE CULTURE

## 2020-09-10 LAB — GC/CHLAMYDIA PROBE AMP
Chlamydia trachomatis, NAA: NEGATIVE
Neisseria Gonorrhoeae by PCR: NEGATIVE

## 2020-09-19 ENCOUNTER — Other Ambulatory Visit: Payer: Self-pay

## 2020-09-19 ENCOUNTER — Ambulatory Visit (INDEPENDENT_AMBULATORY_CARE_PROVIDER_SITE_OTHER): Payer: Medicaid Other

## 2020-09-19 ENCOUNTER — Other Ambulatory Visit: Payer: Self-pay | Admitting: Advanced Practice Midwife

## 2020-09-19 DIAGNOSIS — Z3490 Encounter for supervision of normal pregnancy, unspecified, unspecified trimester: Secondary | ICD-10-CM

## 2020-09-19 DIAGNOSIS — Z3A01 Less than 8 weeks gestation of pregnancy: Secondary | ICD-10-CM

## 2020-09-19 NOTE — Progress Notes (Signed)
[place] 6+6 wks,single IUP,FHR 148 BPM,CRL 12.29 mm,normal right ovary,left ovary not visualized

## 2020-09-30 ENCOUNTER — Telehealth: Payer: Self-pay | Admitting: Advanced Practice Midwife

## 2020-09-30 ENCOUNTER — Other Ambulatory Visit: Payer: Self-pay | Admitting: Advanced Practice Midwife

## 2020-09-30 MED ORDER — ONDANSETRON 4 MG PO TBDP: 4.0000 mg | ORAL_TABLET | Freq: Three times a day (TID) | ORAL | 1 refills | Status: AC | PRN

## 2020-09-30 NOTE — Telephone Encounter (Signed)
Pt states nausea meds aren't helping, unable to eat for 3 days, meds make her sleepy  Please advise & notify pt    [place]

## 2020-10-03 ENCOUNTER — Telehealth: Payer: Self-pay | Admitting: Obstetrics & Gynecology

## 2020-10-03 NOTE — Telephone Encounter (Signed)
Pt would like a nurse to give her a call to explain the ultrasound report.  She read it on he MyChart & doesn't understand  Please advise & notify pt

## 2020-10-04 ENCOUNTER — Other Ambulatory Visit: Payer: Self-pay | Admitting: Women's Health

## 2020-10-04 MED ORDER — PROMETHAZINE HCL 12.5 MG RE SUPP: 12.5000 mg | Freq: Four times a day (QID) | RECTAL | 0 refills | Status: DC | PRN

## 2020-10-04 NOTE — Telephone Encounter (Signed)
Returned pt's call. Two identifiers used. Explained [place] to pt. Pt began explaining that she was extremely nauseated and has been unable to keep anything down for a few days. Pt is taking Zofran twice daily and Diclegis at HS. Pt was instructed to try taking those medications as directed and see if that would help. Pt explained that she vomits the Diclegis right after she takes it typically. Asked pt if she would be willing to try a suppository. Pt agreed. Will forward concerns to Joellyn Haff to address. Pt confirmed understanding.

## 2020-10-09 ENCOUNTER — Emergency Department (HOSPITAL_COMMUNITY)
Admission: EM | Admit: 2020-10-09 | Discharge: 2020-10-09 | Disposition: A | Discharge status: Home / Self Care | Payer: Medicaid Other | Attending: Emergency Medicine | Admitting: Emergency Medicine

## 2020-10-09 ENCOUNTER — Encounter (HOSPITAL_COMMUNITY): Payer: Self-pay | Admitting: Emergency Medicine

## 2020-10-09 ENCOUNTER — Emergency Department (HOSPITAL_COMMUNITY): Payer: Medicaid Other

## 2020-10-09 ENCOUNTER — Other Ambulatory Visit: Payer: Self-pay

## 2020-10-09 DIAGNOSIS — Z79899 Other long term (current) drug therapy: Secondary | ICD-10-CM | POA: Diagnosis not present

## 2020-10-09 DIAGNOSIS — I10 Essential (primary) hypertension: Secondary | ICD-10-CM | POA: Insufficient documentation

## 2020-10-09 DIAGNOSIS — N949 Unspecified condition associated with female genital organs and menstrual cycle: Secondary | ICD-10-CM

## 2020-10-09 DIAGNOSIS — O23591 Infection of other part of genital tract in pregnancy, first trimester: Secondary | ICD-10-CM | POA: Insufficient documentation

## 2020-10-09 DIAGNOSIS — Z3A09 9 weeks gestation of pregnancy: Secondary | ICD-10-CM | POA: Diagnosis not present

## 2020-10-09 DIAGNOSIS — B9689 Other specified bacterial agents as the cause of diseases classified elsewhere: Secondary | ICD-10-CM | POA: Diagnosis not present

## 2020-10-09 DIAGNOSIS — Z87891 Personal history of nicotine dependence: Secondary | ICD-10-CM | POA: Insufficient documentation

## 2020-10-09 DIAGNOSIS — R1030 Lower abdominal pain, unspecified: Secondary | ICD-10-CM

## 2020-10-09 DIAGNOSIS — O26859 Spotting complicating pregnancy, unspecified trimester: Secondary | ICD-10-CM

## 2020-10-09 LAB — CBC WITH DIFFERENTIAL/PLATELET
Abs Immature Granulocytes: 0.05 10*3/uL (ref 0.00–0.07)
Basophils Absolute: 0 10*3/uL (ref 0.0–0.1)
Basophils Relative: 0 %
Eosinophils Absolute: 0.2 10*3/uL (ref 0.0–0.5)
Eosinophils Relative: 2 %
HCT: 37 % (ref 36.0–46.0)
Hemoglobin: 12 g/dL (ref 12.0–15.0)
Immature Granulocytes: 1 %
Lymphocytes Relative: 35 %
Lymphs Abs: 3.2 10*3/uL (ref 0.7–4.0)
MCH: 27.6 pg (ref 26.0–34.0)
MCHC: 32.4 g/dL (ref 30.0–36.0)
MCV: 85.3 fL (ref 80.0–100.0)
Monocytes Absolute: 0.7 10*3/uL (ref 0.1–1.0)
Monocytes Relative: 8 %
Neutro Abs: 5 10*3/uL (ref 1.7–7.7)
Neutrophils Relative %: 54 %
Platelets: 253 10*3/uL (ref 150–400)
RBC: 4.34 MIL/uL (ref 3.87–5.11)
RDW: 14.2 % (ref 11.5–15.5)
WBC: 9.1 10*3/uL (ref 4.0–10.5)
nRBC: 0 % (ref 0.0–0.2)

## 2020-10-09 LAB — WET PREP, GENITAL
Sperm: NONE SEEN
Trich, Wet Prep: NONE SEEN
Yeast Wet Prep HPF POC: NONE SEEN

## 2020-10-09 LAB — URINALYSIS, ROUTINE W REFLEX MICROSCOPIC
Bilirubin Urine: NEGATIVE
Glucose, UA: NEGATIVE mg/dL
Hgb urine dipstick: NEGATIVE
Ketones, ur: NEGATIVE mg/dL
Leukocytes,Ua: NEGATIVE
Nitrite: NEGATIVE
Protein, ur: 30 mg/dL — AB
Specific Gravity, Urine: 1.025 (ref 1.005–1.030)
pH: 7 (ref 5.0–8.0)

## 2020-10-09 LAB — COMPREHENSIVE METABOLIC PANEL
ALT: 14 U/L (ref 0–44)
AST: 13 U/L — ABNORMAL LOW (ref 15–41)
Albumin: 3.3 g/dL — ABNORMAL LOW (ref 3.5–5.0)
Alkaline Phosphatase: 41 U/L (ref 38–126)
Anion gap: 4 — ABNORMAL LOW (ref 5–15)
BUN: 8 mg/dL (ref 6–20)
CO2: 26 mmol/L (ref 22–32)
Calcium: 8.9 mg/dL (ref 8.9–10.3)
Chloride: 103 mmol/L (ref 98–111)
Creatinine, Ser: 0.72 mg/dL (ref 0.44–1.00)
GFR, Estimated: >60 mL/min (ref >60)
Glucose, Bld: 93 mg/dL (ref 70–99)
Potassium: 4 mmol/L (ref 3.5–5.1)
Sodium: 133 mmol/L — ABNORMAL LOW (ref 135–145)
Total Bilirubin: 0.5 mg/dL (ref 0.3–1.2)
Total Protein: 6.5 g/dL (ref 6.5–8.1)

## 2020-10-09 MED ORDER — METRONIDAZOLE 0.75 % VA GEL: 1.0000 | Freq: Every day | VAGINAL | 0 refills | Status: AC

## 2020-10-09 MED ORDER — ACETAMINOPHEN 500 MG PO TABS
1000.0000 mg | ORAL_TABLET | Freq: Once | ORAL | Status: AC
  Administered 2020-10-09: 1000 mg via ORAL
  Filled 2020-10-09: qty 2

## 2020-10-09 MED ORDER — PROMETHAZINE HCL 25 MG/ML IJ SOLN
25.0000 mg | Freq: Four times a day (QID) | INTRAMUSCULAR | Status: DC | PRN
  Administered 2020-10-09: 25 mg via INTRAMUSCULAR
  Filled 2020-10-09: qty 1

## 2020-10-09 NOTE — ED Provider Notes (Signed)
[facility] EMERGENCY DEPARTMENT Provider Note   CSN: [id number] Arrival date & time: 10/09/20  1220     History Chief Complaint  Patient presents with   Abdominal Pain    Cramping spotting [redacted] wks pregnant    Abbeygail Igoe is a 30 y.o. female.  Janelys Glassner is a 30 y.o. female who is estimated to be about [redacted] weeks pregnant, with a history of hypertension, who presents to the emergency department for evaluation of lower abdominal cramping and spotting.  Patient reports she has been having some cramping throughout this pregnancy but it has been more persistent over the past week.  She is also been dealing with nausea and vomiting throughout this pregnancy and has been treated with Diclegis, Zofran and Phenergan suppositories by her OB.  She reports that today she started having some spotting she reports seeing a very small amount of blood on the toilet paper when wiping but no heavier bleeding.  She sent a message to her OB and they recommended she come to the emergency department for evaluation.  She has previously had an ultrasound in September that showed an intrauterine pregnancy.  Denies vaginal discharge, denies dysuria.  No fevers or chills.  No other aggravating or alleviating factors.  The history is provided by the patient and medical records.      Past Medical History:  Diagnosis Date   Hypertension     Patient Active Problem List   Diagnosis Date Noted   Early stage of pregnancy 09/07/2020    History reviewed. No pertinent surgical history.   OB History     Gravida  6   Para  1   Term  1   Preterm      AB  4   Living  1      SAB  2   IAB      Ectopic      Multiple      Live Births  1           Family History  Problem Relation Age of Onset   Diabetes Paternal Grandfather    Stroke Maternal Grandmother    Other Maternal Grandfather        Covid   Congestive Heart Failure Father    HIV Mother    Cancer Mother    Ovarian cancer Mother 34    Polycystic ovary syndrome Sister    Asthma Daughter     Social History   Tobacco Use   Smoking status: Former    Types: Cigarettes    Quit date: 08/21/2017    Years since quitting: 3.1   Smokeless tobacco: Never  Vaping Use   Vaping Use: Never used  Substance Use Topics   Alcohol use: Not Currently   Drug use: Not Currently    Types: Marijuana    Comment: "sometimes" 2 x a week     Home Medications Prior to Admission medications   Medication Sig Start Date End Date Taking? Authorizing Provider  promethazine (PHENADOZ) 12.5 MG suppository Place 1 suppository (12.5 mg total) rectally every 6 (six) hours as needed for nausea or vomiting. 10/04/20   Cheral Marker, CNM  albuterol (VENTOLIN HFA) 108 (90 Base) MCG/ACT inhaler Inhale into the lungs.    [provider]  Doxylamine-Pyridoxine (DICLEGIS) 10-10 MG TBEC 2 tabs q hs, if sx persist add 1 tab q am on day 3, if sx persist add 1 tab q afternoon on day 4 09/09/20   Cheral Marker,  CNM  labetalol (NORMODYNE) 100 MG tablet Take by mouth.    [provider]  metroNIDAZOLE (METROGEL VAGINAL) 0.75 % vaginal gel Place 1 Applicatorful vaginally at bedtime for 5 days. 10/09/20 10/14/20 Yes Dartha Lodge, PA-C  ondansetron (ZOFRAN ODT) 4 MG disintegrating tablet Take 1 tablet (4 mg total) by mouth every 8 (eight) hours as needed for nausea or vomiting. 09/30/20   Arabella Merles, CNM    Allergies    Shellfish-derived products  Review of Systems   Review of Systems  Constitutional:  Negative for chills and fever.  HENT: Negative.    Respiratory:  Negative for cough and shortness of breath.   Cardiovascular:  Negative for chest pain.  Gastrointestinal:  Positive for abdominal pain, nausea and vomiting. Negative for constipation and diarrhea.  Genitourinary:  Positive for vaginal bleeding. Negative for dysuria and vaginal discharge.  Musculoskeletal:  Negative for arthralgias and myalgias.  Skin:  Negative for  color change and rash.  Neurological:  Negative for dizziness, syncope and light-headedness.  All other systems reviewed and are negative.  Physical Exam Updated Vital Signs BP (!) 127/92   Pulse 88   Temp 98.9 F (37.2 C) (Oral)   Resp 18   Ht 5\' 9"  (1.753 m)   Wt 122.5 kg   LMP 08/02/2020 (Exact Date)   SpO2 100%   BMI 39.87 kg/m   Physical Exam Vitals and nursing note reviewed.  Constitutional:      General: She is not in acute distress.    Appearance: Normal appearance. She is well-developed. She is obese. She is not ill-appearing or diaphoretic.  HENT:     Head: Normocephalic and atraumatic.     Mouth/Throat:     Mouth: Mucous membranes are moist.     Pharynx: Oropharynx is clear.  Eyes:     General:        Right eye: No discharge.        Left eye: No discharge.     Pupils: Pupils are equal, round, and reactive to light.  Cardiovascular:     Rate and Rhythm: Normal rate and regular rhythm.     Pulses: Normal pulses.     Heart sounds: Normal heart sounds.  Pulmonary:     Effort: Pulmonary effort is normal. No respiratory distress.     Breath sounds: Normal breath sounds. No wheezing or rales.     Comments: Respirations equal and unlabored, patient able to speak in full sentences, lungs clear to auscultation bilaterally  Abdominal:     General: Bowel sounds are normal. There is no distension.     Palpations: Abdomen is soft. There is no mass.     Tenderness: There is no abdominal tenderness. There is no guarding.     Comments: Gravid abdomen soft, nontender to palpation in all quadrants without guarding or peritoneal signs  Genitourinary:    Comments: Chaperone present during pelvic exam No external genital lesions noted On speculum exam very small amount of discharge and no bleeding noted, the cervical os is closed No cervical motion tenderness or adnexal tenderness. Musculoskeletal:        General: No deformity.     Cervical back: Neck supple.  Skin:     General: Skin is warm and dry.     Capillary Refill: Capillary refill takes less than 2 seconds.  Neurological:     Mental Status: She is alert and oriented to person, place, and time.     Coordination: Coordination normal.  Comments: Speech is clear, able to follow commands Moves extremities without ataxia, coordination intact  Psychiatric:        Mood and Affect: Mood is anxious.        Behavior: Behavior normal.    ED Results / Procedures / Treatments   Labs (all labs ordered are listed, but only abnormal results are displayed) Labs Reviewed  WET PREP, GENITAL - Abnormal; Notable for the following components:      Result Value   Clue Cells Wet Prep HPF POC PRESENT (*)    WBC, Wet Prep HPF POC MANY (*)    All other components within normal limits  COMPREHENSIVE METABOLIC PANEL - Abnormal; Notable for the following components:   Sodium 133 (*)    Albumin 3.3 (*)    AST 13 (*)    Anion gap 4 (*)    All other components within normal limits  URINALYSIS, ROUTINE W REFLEX MICROSCOPIC - Abnormal; Notable for the following components:   Color, Urine AMBER (*)    APPearance CLOUDY (*)    Protein, ur 30 (*)    Bacteria, UA RARE (*)    All other components within normal limits  CBC WITH DIFFERENTIAL/PLATELET  GC/CHLAMYDIA PROBE AMP ([place]) NOT AT [facility]    EKG None  Radiology US OB Comp Less 14 Wks  Result Date: 10/09/2020 CLINICAL DATA:  Cramping for 1 week. Spotting. By first ultrasound, [facility] 05/09/2021 patient is 9 weeks 5 days. EXAM: OBSTETRIC <14 WK ULTRASOUND TECHNIQUE: Transabdominal ultrasound was performed for evaluation of the gestation as well as the maternal uterus and adnexal regions. COMPARISON:  09/19/2020 FINDINGS: Intrauterine gestational sac: Single Yolk sac:  Not Visualized. Embryo:  Visualized. Cardiac Activity: Visualized. Heart Rate: 176 bpm CRL:   29.0 mm   9 w 5 d                  [place] EDC: 05/09/2020 Subchorionic hemorrhage:  No subchorionic hemorrhage.  Maternal uterus/adnexae: Ovaries are not well seen. No free pelvic fluid. Study quality is degraded by patient body habitus. IMPRESSION: 1. Single living intrauterine embryo. 2. Size and dates correlate well. 3. No subchorionic hemorrhage or adnexal mass. Electronically Signed   By: Norva Pavlov M.D.   On: 10/09/2020 15:44    Procedures Procedures   Medications Ordered in ED Medications  promethazine (PHENERGAN) injection 25 mg (25 mg Intramuscular Given 10/09/20 1639)  acetaminophen (TYLENOL) tablet 1,000 mg (1,000 mg Oral Given 10/09/20 1638)    ED Course  I have reviewed the triage vital signs and the nursing notes.  Pertinent labs & imaging results that were available during my care of the patient were reviewed by me and considered in my medical decision making (see chart for details).    MDM Rules/Calculators/A&P                           30 year old female estimated to be about [redacted] weeks pregnant presents with persistent lower abdominal cramping and some spotting which started today.  Follow[facility], has previously had ultrasound that showed intrauterine pregnancy.  Also struggling with nausea and vomiting during her pregnancy.  Will get labs and pelvic ultrasound to assess for placenta previa, subchorionic hemorrhage or other potential cause for patient's bleeding could also be having a miscarriage potentially, cervical os is closed.  No bleeding noted on pelvic exam but wet prep was positive for BV.  I have independently ordered, reviewed  and interpreted all labs and imaging: CBC: No leukocytosis, normal hemoglobin CMP: Very minimal hyponatremia, no other significant electrolyte derangements UA: There is some rare bacteria present but with 21-50 squamous cells I suspect this is due to contamination. Wet prep: Consistent with BV  Pelvic ultrasound shows a 9 w 5 d gestation based on size, no subchorionic hemorrhage, appropriate fetal heart rate.  Discussed  reassuring results with patient.  Nausea was treated with Phenergan and now she is feeling much better.  Tolerating p.o. fluids.  Will treat with MetroGel for BV, given patient is already struggling with nausea and vomiting during pregnancy concerned that oral metronidazole would likely make this worse.  We will have her follow-up closely with her OB/GYN but work-up today is reassuring and no bleeding was seen on exam.  She expresses understanding and agreement.  Discharged home in good condition.  Final Clinical Impression(s) / ED Diagnoses Final diagnoses:  Lower abdominal pain  Round ligament pain  BV (bacterial vaginosis)    Rx / DC Orders ED Discharge Orders          Ordered    metroNIDAZOLE (METROGEL VAGINAL) 0.75 % vaginal gel  Daily at bedtime        10/09/20 1813             Dartha Lodge, [place] 10/12/20 0934    Pollyann Savoy, MD 10/12/20 1258

## 2020-10-09 NOTE — ED Triage Notes (Signed)
Pt [redacted] weeks pregnant and complains of spotting that started yesterday and cramping that start approx 1 week ago. Contacted OB when cramping started and was told it was sent zofran prescription. Was told by OB that it was just discomfort from "growing".

## 2020-10-09 NOTE — Discharge Instructions (Addendum)
Your evaluation today was overall reassuring.  I suspect a lot of lower abdominal pain you are having is due to round ligament pain from growing pregnancy  You could purchase a belly band to help support her stomach and reduce this pain.  If you have any additional spotting follow-up with your OB/GYN.  Your testing did come back positive for bacterial vaginosis today.  Use metronidazole gel vaginally daily at bedtime for the next 5 days.  Continue using medications prescribed by her OB/GYN for nausea and vomiting.  Return for new or worsening symptoms.

## 2020-10-11 ENCOUNTER — Inpatient Hospital Stay (HOSPITAL_COMMUNITY)
Admission: AD | Admit: 2020-10-11 | Discharge: 2020-10-11 | Disposition: A | Discharge status: Home / Self Care | Payer: Medicaid Other | Attending: Obstetrics & Gynecology | Admitting: Obstetrics & Gynecology

## 2020-10-11 DIAGNOSIS — Z3A10 10 weeks gestation of pregnancy: Secondary | ICD-10-CM | POA: Diagnosis not present

## 2020-10-11 DIAGNOSIS — O4691 Antepartum hemorrhage, unspecified, first trimester: Secondary | ICD-10-CM

## 2020-10-11 DIAGNOSIS — O26891 Other specified pregnancy related conditions, first trimester: Secondary | ICD-10-CM | POA: Diagnosis not present

## 2020-10-11 DIAGNOSIS — N898 Other specified noninflammatory disorders of vagina: Secondary | ICD-10-CM

## 2020-10-11 DIAGNOSIS — Z87891 Personal history of nicotine dependence: Secondary | ICD-10-CM | POA: Insufficient documentation

## 2020-10-11 DIAGNOSIS — O209 Hemorrhage in early pregnancy, unspecified: Secondary | ICD-10-CM | POA: Diagnosis not present

## 2020-10-11 DIAGNOSIS — Z3491 Encounter for supervision of normal pregnancy, unspecified, first trimester: Secondary | ICD-10-CM

## 2020-10-11 DIAGNOSIS — R109 Unspecified abdominal pain: Secondary | ICD-10-CM

## 2020-10-11 LAB — URINALYSIS, ROUTINE W REFLEX MICROSCOPIC
Bilirubin Urine: NEGATIVE
Glucose, UA: NEGATIVE mg/dL
Hgb urine dipstick: NEGATIVE
Ketones, ur: 5 mg/dL — AB
Leukocytes,Ua: NEGATIVE
Nitrite: NEGATIVE
Protein, ur: 30 mg/dL — AB
Specific Gravity, Urine: 1.03 (ref 1.005–1.030)
pH: 5 (ref 5.0–8.0)

## 2020-10-11 LAB — WET PREP, GENITAL
Clue Cells Wet Prep HPF POC: NONE SEEN
Sperm: NONE SEEN
Trich, Wet Prep: NONE SEEN
WBC, Wet Prep HPF POC: NONE SEEN
Yeast Wet Prep HPF POC: NONE SEEN

## 2020-10-11 MED ORDER — ACETAMINOPHEN 500 MG PO TABS
1000.0000 mg | ORAL_TABLET | Freq: Once | ORAL | Status: AC
  Administered 2020-10-11: 1000 mg via ORAL
  Filled 2020-10-11: qty 2

## 2020-10-11 NOTE — MAU Provider Note (Signed)
History     CSN: [id number]  Arrival date and time: 10/11/20 1642   None     Chief Complaint  Patient presents with   Abdominal Pain   Vaginal Bleeding   HPI Brandi Mason is a 30 y.o. [id number] at [redacted]w[redacted]d who presents to MAU via EMS with chief complaints of vaginal pressure and vaginal spotting. These are recurrent problems for which patient was evaluated at [facility] ED on 10/09/2020. Patient states today her sense of vaginal pressure intensified but her bleeding is unchanged from onset. She is compliant with Metrogel for BV as prescribed at [facility]. She denies dysuria, abdominal tenderness, flank pain, fever or recent illness.   OB History     Gravida  6   Para  1   Term  1   Preterm      AB  4   Living  1      SAB  2   IAB      Ectopic      Multiple      Live Births  1           Past Medical History:  Diagnosis Date   Hypertension     No past surgical history on file.  Family History  Problem Relation Age of Onset   Diabetes Paternal Grandfather    Stroke Maternal Grandmother    Other Maternal Grandfather        Covid   Congestive Heart Failure Father    HIV Mother    Cancer Mother    Ovarian cancer Mother 20   Polycystic ovary syndrome Sister    Asthma Daughter     Social History   Tobacco Use   Smoking status: Former    Types: Cigarettes    Quit date: 08/21/2017    Years since quitting: 3.1   Smokeless tobacco: Never  Vaping Use   Vaping Use: Never used  Substance Use Topics   Alcohol use: Not Currently   Drug use: Not Currently    Types: Marijuana    Comment: "sometimes" 2 x a week     Allergies:  Allergies  Allergen Reactions   Shellfish-Derived Products Hives    Medications Prior to Admission  Medication Sig Dispense Refill Last Dose   metroNIDAZOLE (METROGEL VAGINAL) 0.75 % vaginal gel Place 1 Applicatorful vaginally at bedtime for 5 days. 50 g 0 10/10/2020   ondansetron (ZOFRAN ODT) 4 MG disintegrating tablet Take 1  tablet (4 mg total) by mouth every 8 (eight) hours as needed for nausea or vomiting. 30 tablet 1 10/10/2020   promethazine (PHENADOZ) 12.5 MG suppository Place 1 suppository (12.5 mg total) rectally every 6 (six) hours as needed for nausea or vomiting. 12 each 0 Past Week   albuterol (VENTOLIN HFA) 108 (90 Base) MCG/ACT inhaler Inhale into the lungs.      Doxylamine-Pyridoxine (DICLEGIS) 10-10 MG TBEC 2 tabs q hs, if sx persist add 1 tab q am on day 3, if sx persist add 1 tab q afternoon on day 4 100 tablet 6    labetalol (NORMODYNE) 100 MG tablet Take by mouth.   10/09/2020    Review of Systems  Genitourinary:  Positive for vaginal bleeding and vaginal pain.  All other systems reviewed and are negative. Physical Exam   Blood pressure 131/76, pulse 90, temperature 98.4 F (36.9 C), temperature source Oral, resp. rate (!) 21, last menstrual period 08/02/2020, SpO2 100 %.  Physical Exam Vitals and nursing note reviewed. Exam conducted with  a chaperone present.  Constitutional:      Appearance: She is well-developed.  Cardiovascular:     Rate and Rhythm: Normal rate.  Pulmonary:     Effort: Pulmonary effort is normal.  Abdominal:     Palpations: Abdomen is soft.     Tenderness: There is no abdominal tenderness.     Comments: Gravid  Genitourinary:    Comments: Pelvic exam: External genitalia normal, vaginal walls pink and well rugated, cervix visually closed, no lesions noted. Small amount of white frothy discharge near cervical os.    Skin:    Capillary Refill: Capillary refill takes less than 2 seconds.  Neurological:     Mental Status: She is alert and oriented to person, place, and time.  Psychiatric:        Mood and Affect: Mood normal.        Behavior: Behavior normal.    MAU Course/MDM  Procedures: speculum exam  Orders Placed This Encounter  Procedures   Wet prep, genital   Urinalysis, Routine w reflex microscopic Urine, Clean Catch   Discharge patient   --No  bleeding observed on speculum exam --Live IUP confirmed 10/02/2020 --FHT and active fetal movement confirmed with BSUS  Component     Latest Ref Rng & Units 10/11/2020  Color, Urine     YELLOW AMBER (A)  Appearance     CLEAR CLOUDY (A)  Specific Gravity, Urine     1.005 - 1.030 1.030  pH     5.0 - 8.0 5.0  Glucose, UA     NEGATIVE mg/dL NEGATIVE  Hgb urine dipstick     NEGATIVE NEGATIVE  Bilirubin Urine     NEGATIVE NEGATIVE  Ketones, ur     NEGATIVE mg/dL 5 (A)  Protein     NEGATIVE mg/dL 30 (A)  Nitrite     NEGATIVE NEGATIVE  Leukocytes,Ua     NEGATIVE NEGATIVE  RBC / HPF     0 - 5 RBC/hpf 0-5  WBC, UA     0 - 5 WBC/hpf 0-5  Bacteria, UA     NONE SEEN RARE (A)  Squamous Epithelial / LPF     0 - 5 6-10  Mucus      PRESENT  Glucose     Negative   Ketones, UA        RBC, UA        Protein,UA     Negative   Nitrite, UA        Leukocytes,UA     Negative    Component     Latest Ref Rng & Units 10/11/2020  Yeast Wet Prep HPF POC     NONE SEEN NONE SEEN  Trich, Wet Prep     NONE SEEN NONE SEEN  Clue Cells Wet Prep HPF POC     NONE SEEN NONE SEEN  WBC, Wet Prep HPF POC     NONE SEEN NONE SEEN  Sperm      NONE SEEN   Assessment and Plan  --30 y.o. [id number] at [redacted]w[redacted]d  --FHT 150 via BSUS --No acute findings --Finish Metrogel as prescribed at outside facility --Discharge home in stable condition  Calvert Cantor, CNM

## 2020-10-11 NOTE — MAU Note (Signed)
...  Brandi Mason is a 30 y.o. at [redacted]w[redacted]d here in MAU via EMS reporting: vaginal spotting and pressure that began yesterday morning. Patient states it feels as if she has to urinate but can't. She states she started feeling an increase in the pains while sitting on the toilet.   Patient holding her stomach and moaning and groaning. She is very resistant to answering questions. Patient has asked EMS twice where her daughter is and they informed her "your daughter is with the lady that is in your house." Patient states it was only her and her daughter in her house and very confused. Patient asked EMS if the lady was "black and fat" and the EMS personnel stated "She was heavy set but not huge." Patient given phone to make phone calls.  Patient denies substance use.

## 2020-10-12 LAB — GC/CHLAMYDIA PROBE AMP (~~LOC~~) NOT AT ARMC
Chlamydia: NEGATIVE
Comment: NEGATIVE
Comment: NORMAL
Neisseria Gonorrhea: NEGATIVE

## 2020-10-24 DIAGNOSIS — O0990 Supervision of high risk pregnancy, unspecified, unspecified trimester: Secondary | ICD-10-CM | POA: Insufficient documentation

## 2020-10-24 DIAGNOSIS — I10 Essential (primary) hypertension: Secondary | ICD-10-CM | POA: Insufficient documentation

## 2020-10-25 ENCOUNTER — Telehealth: Payer: Self-pay | Admitting: Women's Health

## 2020-10-25 ENCOUNTER — Other Ambulatory Visit: Payer: Self-pay | Admitting: Obstetrics & Gynecology

## 2020-10-25 DIAGNOSIS — Z3682 Encounter for antenatal screening for nuchal translucency: Secondary | ICD-10-CM

## 2020-10-25 NOTE — Telephone Encounter (Signed)
Pt had to reschedule her New [facility] visit due to a death in the family Pt wants to know if it is OK to use a OTC suppository daily for her constipation  Please advise & notify pt

## 2020-10-25 NOTE — Telephone Encounter (Signed)
Sent pt a Mychart msg with constipation relief measures. Returned pt's call. Two identifiers used. Clarified with pt that she can take 1 suppository per day, but given that she hasn't had a BM in 12 days, she needs to try an enema and some stool softeners. Pt was instructed on the directions. Pt confirmed understanding.

## 2020-10-26 ENCOUNTER — Encounter: Payer: Medicaid Other | Admitting: Advanced Practice Midwife

## 2020-10-26 ENCOUNTER — Ambulatory Visit: Payer: Medicaid Other | Admitting: *Deleted

## 2020-10-26 ENCOUNTER — Other Ambulatory Visit: Payer: Medicaid Other

## 2020-10-26 DIAGNOSIS — O0991 Supervision of high risk pregnancy, unspecified, first trimester: Secondary | ICD-10-CM

## 2020-11-03 ENCOUNTER — Ambulatory Visit: Payer: Medicaid Other | Admitting: *Deleted

## 2020-11-03 ENCOUNTER — Encounter: Payer: Medicaid Other | Admitting: Women's Health

## 2020-11-03 DIAGNOSIS — O0992 Supervision of high risk pregnancy, unspecified, second trimester: Secondary | ICD-10-CM

## 2020-11-09 ENCOUNTER — Ambulatory Visit
Admission: EM | Admit: 2020-11-09 | Discharge: 2020-11-09 | Disposition: A | Discharge status: Home / Self Care | Payer: Medicaid Other | Attending: Urgent Care | Admitting: Urgent Care

## 2020-11-09 ENCOUNTER — Other Ambulatory Visit: Payer: Self-pay

## 2020-11-09 ENCOUNTER — Encounter: Payer: Self-pay | Admitting: Emergency Medicine

## 2020-11-09 DIAGNOSIS — Z3A15 15 weeks gestation of pregnancy: Secondary | ICD-10-CM

## 2020-11-09 DIAGNOSIS — O2342 Unspecified infection of urinary tract in pregnancy, second trimester: Secondary | ICD-10-CM

## 2020-11-09 DIAGNOSIS — R300 Dysuria: Secondary | ICD-10-CM

## 2020-11-09 DIAGNOSIS — R350 Frequency of micturition: Secondary | ICD-10-CM

## 2020-11-09 LAB — POCT URINALYSIS DIP (MANUAL ENTRY)
Bilirubin, UA: NEGATIVE
Blood, UA: NEGATIVE
Glucose, UA: NEGATIVE mg/dL
Leukocytes, UA: NEGATIVE
Nitrite, UA: NEGATIVE
Protein Ur, POC: NEGATIVE mg/dL
Spec Grav, UA: 1.025 (ref 1.010–1.025)
Urobilinogen, UA: 0.2 E.U./dL
pH, UA: 6.5 (ref 5.0–8.0)

## 2020-11-09 NOTE — ED Triage Notes (Signed)
Lower ABD pressure  and a tingling feeling when urinating.  Sometimes she feels a sharp pain when urinating.

## 2020-11-09 NOTE — Discharge Instructions (Addendum)
Please head over to [facility] as you are having lower abdominal pain and with your pregnancy can be dangerous. There they can do more testing to make sure you and your pregnancy are okay.

## 2020-11-09 NOTE — ED Provider Notes (Signed)
[facility]   MRN: [id number] DOB: 1990/12/19  Subjective:   Brandi Mason is a 30 y.o. female presenting for several day history of acute onset lower abdominal pressure, dysuria and urinary frequency.  Patient is [redacted] weeks pregnant.  Reports that she did have BV back in September which was treated.  No fever, nausea, vomiting, vaginal bleeding.  No frank abdominal pain.  No current facility-administered medications for this encounter.  Current Outpatient Medications:    albuterol (VENTOLIN HFA) 108 (90 Base) MCG/ACT inhaler, Inhale into the lungs., Disp: , Rfl:    Doxylamine-Pyridoxine (DICLEGIS) 10-10 MG TBEC, 2 tabs q hs, if sx persist add 1 tab q am on day 3, if sx persist add 1 tab q afternoon on day 4, Disp: 100 tablet, Rfl: 6   labetalol (NORMODYNE) 100 MG tablet, Take by mouth., Disp: , Rfl:    ondansetron (ZOFRAN ODT) 4 MG disintegrating tablet, Take 1 tablet (4 mg total) by mouth every 8 (eight) hours as needed for nausea or vomiting., Disp: 30 tablet, Rfl: 1   promethazine (PHENADOZ) 12.5 MG suppository, Place 1 suppository (12.5 mg total) rectally every 6 (six) hours as needed for nausea or vomiting., Disp: 12 each, Rfl: 0   Allergies  Allergen Reactions   Shellfish-Derived Products Hives    Past Medical History:  Diagnosis Date   Hypertension      History reviewed. No pertinent surgical history.  Family History  Problem Relation Age of Onset   Diabetes Paternal Grandfather    Stroke Maternal Grandmother    Other Maternal Grandfather        Covid   Congestive Heart Failure Father    HIV Mother    Cancer Mother    Ovarian cancer Mother 69   Polycystic ovary syndrome Sister    Asthma Daughter     Social History   Tobacco Use   Smoking status: Former    Types: Cigarettes    Quit date: 08/21/2017    Years since quitting: 3.2   Smokeless tobacco: Never  Vaping Use   Vaping Use: Never used  Substance Use Topics   Alcohol use: Not Currently    Drug use: Not Currently    Types: Marijuana    Comment: "sometimes" 2 x a week     ROS   Objective:   Vitals: BP 133/64 (BP Location: Right Arm)   Pulse (!) 106   Temp 99 F (37.2 C) (Oral)   Resp 18   LMP 08/02/2020 (Exact Date)   SpO2 99%   Physical Exam Constitutional:      General: She is not in acute distress.    Appearance: Normal appearance. She is well-developed. She is not ill-appearing, toxic-appearing or diaphoretic.  HENT:     Head: Normocephalic and atraumatic.     Nose: Nose normal.     Mouth/Throat:     Mouth: Mucous membranes are moist.     Pharynx: Oropharynx is clear.  Eyes:     General: No scleral icterus.       Right eye: No discharge.        Left eye: No discharge.     Extraocular Movements: Extraocular movements intact.     Conjunctiva/sclera: Conjunctivae normal.     Pupils: Pupils are equal, round, and reactive to light.  Cardiovascular:     Rate and Rhythm: Normal rate.  Pulmonary:     Effort: Pulmonary effort is normal.  Abdominal:     General: Bowel sounds are normal. There  is no distension.     Palpations: Abdomen is soft. There is no mass.     Tenderness: There is abdominal tenderness (lower pelvic on either side). There is no right CVA tenderness, left CVA tenderness, guarding or rebound.  Skin:    General: Skin is warm and dry.  Neurological:     General: No focal deficit present.     Mental Status: She is alert and oriented to person, place, and time.  Psychiatric:        Mood and Affect: Mood normal.        Behavior: Behavior normal.        Thought Content: Thought content normal.        Judgment: Judgment normal.    Results for orders placed or performed during the hospital encounter of 11/09/20 (from the past 24 hour(s))  POCT urinalysis dipstick     Status: Abnormal   Collection Time: 11/09/20  5:30 PM  Result Value Ref Range   Color, UA yellow yellow   Clarity, UA clear clear   Glucose, UA negative negative mg/dL    Bilirubin, UA negative negative   Ketones, POC UA trace (5) (A) negative mg/dL   Spec Grav, UA 1.245 8.099 - 1.025   Blood, UA negative negative   pH, UA 6.5 5.0 - 8.0   Protein Ur, POC negative negative mg/dL   Urobilinogen, UA 0.2 0.2 or 1.0 E.U./dL   Nitrite, UA Negative Negative   Leukocytes, UA Negative Negative    Assessment and Plan :   PDMP not reviewed this encounter.  1. Dysuria   2. Urinary frequency   3. [redacted] weeks gestation of pregnancy    Urinalysis was equivocal, urine culture pending.  Given patient's abdominal pain, recommended further evaluation to make sure she and her pregnancy are safe.  We will forego testing for the cervical swab given that she is presenting to the emergency room now anyway.  Contracts for safety and will go there now.  Patient is safe to go by personal vehicle given that she is hemodynamically stable.   Wallis Bamberg, PA-C 11/09/20 1752

## 2020-11-19 ENCOUNTER — Other Ambulatory Visit: Payer: Self-pay

## 2020-11-19 ENCOUNTER — Emergency Department (HOSPITAL_COMMUNITY)
Admission: EM | Admit: 2020-11-19 | Discharge: 2020-11-19 | Disposition: A | Discharge status: Home / Self Care | Payer: Medicaid Other | Attending: Emergency Medicine | Admitting: Emergency Medicine

## 2020-11-19 DIAGNOSIS — Z3A15 15 weeks gestation of pregnancy: Secondary | ICD-10-CM | POA: Insufficient documentation

## 2020-11-19 DIAGNOSIS — O10012 Pre-existing essential hypertension complicating pregnancy, second trimester: Secondary | ICD-10-CM | POA: Insufficient documentation

## 2020-11-19 DIAGNOSIS — O23592 Infection of other part of genital tract in pregnancy, second trimester: Secondary | ICD-10-CM | POA: Diagnosis present

## 2020-11-19 DIAGNOSIS — B9689 Other specified bacterial agents as the cause of diseases classified elsewhere: Secondary | ICD-10-CM | POA: Diagnosis not present

## 2020-11-19 DIAGNOSIS — Z87891 Personal history of nicotine dependence: Secondary | ICD-10-CM | POA: Insufficient documentation

## 2020-11-19 DIAGNOSIS — B3731 Acute candidiasis of vulva and vagina: Secondary | ICD-10-CM

## 2020-11-19 LAB — URINALYSIS, ROUTINE W REFLEX MICROSCOPIC
Bilirubin Urine: NEGATIVE
Glucose, UA: NEGATIVE mg/dL
Hgb urine dipstick: NEGATIVE
Ketones, ur: NEGATIVE mg/dL
Nitrite: NEGATIVE
Protein, ur: 30 mg/dL — AB
Specific Gravity, Urine: 1.026 (ref 1.005–1.030)
pH: 7 (ref 5.0–8.0)

## 2020-11-19 LAB — WET PREP, GENITAL
Clue Cells Wet Prep HPF POC: NONE SEEN
Sperm: NONE SEEN
Trich, Wet Prep: NONE SEEN

## 2020-11-19 MED ORDER — MICONAZOLE NITRATE 2 % VA CREA: 1.0000 | TOPICAL_CREAM | Freq: Every day | VAGINAL | 0 refills | Status: DC

## 2020-11-19 MED ORDER — CLOTRIMAZOLE 1 % VA CREA: 1.0000 | TOPICAL_CREAM | Freq: Every day | VAGINAL | 0 refills | Status: AC

## 2020-11-19 NOTE — ED Triage Notes (Signed)
Pt arrived complaining of pressure and irritation in vagina when urinating for past 3 days. Pt is concerned about yeast infection.  States pain only occurs with urination. No vaginal bleeding or discharge. Denies abdominal cramping  Pt is 21months pregnant.  Hx of BV diagnosis in Oct, pt states symptoms had resolved

## 2020-11-19 NOTE — Discharge Instructions (Signed)
Contact a health care provider if: You have a fever. Your symptoms go away and then return. Your symptoms do not get better with treatment. Your symptoms get worse. You have new symptoms. You develop blisters in or around your vagina. You have blood coming from your vagina and it is not your menstrual period. You develop severe pain in your abdomen.

## 2020-11-19 NOTE — ED Provider Notes (Signed)
[facility] EMERGENCY DEPARTMENT Provider Note   CSN: [id number] Arrival date & time: 11/19/20  0915     History No chief complaint on file.   Brandi Mason is a 30 y.o. female estimated 15-week 4-day pregnancy who presents emergency department with chief complaint of vaginal irritation.  Patient states that she has had burning when she urinates on her vulva.  She has itching and irritation in her vaginal canal.  She was recently treated about a month ago with MetroGel for bacterial vaginosis.  Also complains of lower abdominal pain which she states is worse when she stands or walks, better with rest.  She states that she was told this is likely round ligament pain.  She complains of pressure in her lower abdomen and feeling like she needs to urinate more often.  She denies flank pain, fever, chills.  She was seen on 11 2 with vaginal symptoms had a urinalysis that was equivocal and a culture that showed presence of lactobacillus.  HPI     Past Medical History:  Diagnosis Date   Hypertension     Patient Active Problem List   Diagnosis Date Noted   Essential hypertension 10/24/2020   Supervision of high-risk pregnancy 10/24/2020   Early stage of pregnancy 09/07/2020    No past surgical history on file.   OB History     Gravida  6   Para  1   Term  1   Preterm      AB  4   Living  1      SAB  2   IAB      Ectopic      Multiple      Live Births  1           Family History  Problem Relation Age of Onset   Diabetes Paternal Grandfather    Stroke Maternal Grandmother    Other Maternal Grandfather        Covid   Congestive Heart Failure Father    HIV Mother    Cancer Mother    Ovarian cancer Mother 75   Polycystic ovary syndrome Sister    Asthma Daughter     Social History   Tobacco Use   Smoking status: Former    Types: Cigarettes    Quit date: 08/21/2017    Years since quitting: 3.2   Smokeless tobacco: Never  Vaping Use   Vaping Use:  Never used  Substance Use Topics   Alcohol use: Not Currently   Drug use: Not Currently    Types: Marijuana    Comment: "sometimes" 2 x a week     Home Medications Prior to Admission medications   Medication Sig Start Date End Date Taking? Authorizing Provider  albuterol (VENTOLIN HFA) 108 (90 Base) MCG/ACT inhaler Inhale into the lungs.    [provider]  Doxylamine-Pyridoxine (DICLEGIS) 10-10 MG TBEC 2 tabs q hs, if sx persist add 1 tab q am on day 3, if sx persist add 1 tab q afternoon on day 4 09/09/20   Cheral Marker, CNM  labetalol (NORMODYNE) 100 MG tablet Take by mouth.    [provider]  ondansetron (ZOFRAN ODT) 4 MG disintegrating tablet Take 1 tablet (4 mg total) by mouth every 8 (eight) hours as needed for nausea or vomiting. 09/30/20   Arabella Merles, CNM  promethazine (PHENADOZ) 12.5 MG suppository Place 1 suppository (12.5 mg total) rectally every 6 (six) hours as needed for nausea or vomiting.  10/04/20   Roma Schanz, CNM    Allergies    Shellfish-derived products  Review of Systems   Review of Systems Ten systems reviewed and are negative for acute change, except as noted in the HPI.   Physical Exam Updated Vital Signs LMP 08/02/2020 (Exact Date)   Physical Exam Vitals and nursing note reviewed.  Constitutional:      General: She is not in acute distress.    Appearance: She is well-developed. She is not diaphoretic.  HENT:     Head: Normocephalic and atraumatic.     Right Ear: External ear normal.     Left Ear: External ear normal.     Nose: Nose normal.     Mouth/Throat:     Mouth: Mucous membranes are moist.  Eyes:     General: No scleral icterus.    Conjunctiva/sclera: Conjunctivae normal.  Cardiovascular:     Rate and Rhythm: Normal rate and regular rhythm.     Heart sounds: Normal heart sounds. No murmur heard.   No friction rub. No gallop.  Pulmonary:     Effort: Pulmonary effort is normal. No respiratory distress.      Breath sounds: Normal breath sounds.  Abdominal:     General: Bowel sounds are normal.     Palpations: Abdomen is soft.     Comments: Gravid abdomen Mildly tender across the lower abdomen without guarding or rebound.  No peritoneal signs.  Genitourinary:    Comments: Pelvic exam escorted by nurse Maximino Greenland  Normal external female genitalia without obvious irritation. Vaginal walls pink, well rugated with minimal thin white discharge. Cervical os visually closed with thick mucoid discharge. Generalized tenderness on exam without cervical motion tenderness.  Patient expresses sensation of irritation and tenderness to the anterior wall of the vagina. Musculoskeletal:     Cervical back: Normal range of motion.  Skin:    General: Skin is warm and dry.  Neurological:     Mental Status: She is alert and oriented to person, place, and time.  Psychiatric:        Behavior: Behavior normal.    ED Results / Procedures / Treatments   Labs (all labs ordered are listed, but only abnormal results are displayed) Labs Reviewed - No data to display  EKG None  Radiology No results found.  Procedures Procedures   Medications Ordered in ED Medications - No data to display  ED Course  I have reviewed the triage vital signs and the nursing notes.  Pertinent labs & imaging results that were available during my care of the patient were reviewed by me and considered in my medical decision making (see chart for details).    MDM Rules/Calculators/A&P                           30 year old female with vaginal irritation.  Pelvic exam is benign.  She is complaining of abdominal pain that seems consistent with round ligament pain.  I ordered and reviewed urinalysis which appears contaminated, wet prep shows yeast and white blood cells.  Chlamydia probe pending.  Patient will be discharged today with treatment for yeast vaginitis with 2% miconazole cream nightly for 7 days.  She will  follow-up with her OB/GYN for any further concerns.  She appears otherwise appropriate for discharge at this time Final Clinical Impression(s) / ED Diagnoses Final diagnoses:  Yeast vaginitis    Rx / DC Orders ED Discharge Orders  None        Margarita Mail, PA-C 11/19/20 1138    Luna Fuse, MD 11/21/20 [phone]

## 2020-11-19 NOTE — ED Notes (Signed)
Pt stopped to give urine sample on way back to room

## 2020-11-21 LAB — GC/CHLAMYDIA PROBE AMP (~~LOC~~) NOT AT ARMC
Chlamydia: NEGATIVE
Comment: NEGATIVE
Comment: NORMAL
Neisseria Gonorrhea: NEGATIVE

## 2020-11-23 ENCOUNTER — Ambulatory Visit: Payer: Medicaid Other | Admitting: *Deleted

## 2020-11-23 ENCOUNTER — Other Ambulatory Visit: Payer: Self-pay

## 2020-11-23 ENCOUNTER — Ambulatory Visit (INDEPENDENT_AMBULATORY_CARE_PROVIDER_SITE_OTHER): Payer: Medicaid Other | Admitting: Advanced Practice Midwife

## 2020-11-23 ENCOUNTER — Encounter: Payer: Self-pay | Admitting: Advanced Practice Midwife

## 2020-11-23 VITALS — BP 127/76 | HR 93 | Wt 306.0 lb

## 2020-11-23 DIAGNOSIS — I10 Essential (primary) hypertension: Secondary | ICD-10-CM | POA: Diagnosis not present

## 2020-11-23 DIAGNOSIS — Z6841 Body Mass Index (BMI) 40.0 and over, adult: Secondary | ICD-10-CM

## 2020-11-23 DIAGNOSIS — Z3A16 16 weeks gestation of pregnancy: Secondary | ICD-10-CM

## 2020-11-23 DIAGNOSIS — O10919 Unspecified pre-existing hypertension complicating pregnancy, unspecified trimester: Secondary | ICD-10-CM | POA: Insufficient documentation

## 2020-11-23 DIAGNOSIS — O0992 Supervision of high risk pregnancy, unspecified, second trimester: Secondary | ICD-10-CM | POA: Diagnosis not present

## 2020-11-23 DIAGNOSIS — J45909 Unspecified asthma, uncomplicated: Secondary | ICD-10-CM | POA: Insufficient documentation

## 2020-11-23 DIAGNOSIS — J4520 Mild intermittent asthma, uncomplicated: Secondary | ICD-10-CM

## 2020-11-23 DIAGNOSIS — Z363 Encounter for antenatal screening for malformations: Secondary | ICD-10-CM

## 2020-11-23 LAB — POCT URINALYSIS DIPSTICK OB
Blood, UA: NEGATIVE
Glucose, UA: NEGATIVE
Ketones, UA: NEGATIVE
Leukocytes, UA: NEGATIVE
Nitrite, UA: NEGATIVE
POC,PROTEIN,UA: NEGATIVE

## 2020-11-23 MED ORDER — ASPIRIN 81 MG PO CHEW: 162.0000 mg | CHEWABLE_TABLET | Freq: Every day | ORAL | 7 refills | Status: AC

## 2020-11-23 MED ORDER — FLUCONAZOLE 150 MG PO TABS: 150.0000 mg | ORAL_TABLET | Freq: Once | ORAL | 0 refills | Status: AC

## 2020-11-23 MED ORDER — BLOOD PRESSURE MONITOR MISC: 0 refills | Status: AC

## 2020-11-23 NOTE — Patient Instructions (Signed)
Brandi Mason, thank you for choosing our office today! We appreciate the opportunity to meet your healthcare needs. You may receive a short survey by mail, e-mail, or through [employer]. If you are happy with your care we would appreciate if you could take just a few minutes to complete the survey questions. We read all of your comments and take your feedback very seriously. Thank you again for choosing our office.  Center for [employer] [facility] Team at [facility]  [facility] at [facility] ([address] [place], [place] [address]) Entrance C, located off of [address] Free 24/7 valet parking   Nausea & Vomiting Have saltine crackers or pretzels by your bed and eat a few bites before you raise your head out of bed in the morning Eat small frequent meals throughout the day instead of large meals Drink plenty of fluids throughout the day to stay hydrated, just don't drink a lot of fluids with your meals.  This can make your stomach fill up faster making you feel sick Do not brush your teeth right after you eat Products with real ginger are good for nausea, like ginger ale and ginger hard candy Make sure it says made with real ginger! Sucking on sour candy like lemon heads is also good for nausea If your prenatal vitamins make you nauseated, take them at night so you will sleep through the nausea Sea Bands If you feel like you need medicine for the nausea & vomiting please let us know If you are unable to keep any fluids or food down please let us know   Constipation Drink plenty of fluid, preferably water, throughout the day Eat foods high in fiber such as fruits, vegetables, and grains Exercise, such as walking, is a good way to keep your bowels regular Drink warm fluids, especially warm prune juice, or decaf coffee Eat a 1/2 cup of real oatmeal (not instant), 1/2 cup applesauce, and 1/2-1 cup warm prune juice every day If needed, you may take Colace (docusate sodium) stool softener  once or twice a day to help keep the stool soft.  If you still are having problems with constipation, you may take Miralax once daily as needed to help keep your bowels regular.   Home Blood Pressure Monitoring for Patients   Your provider has recommended that you check your blood pressure (BP) at least once a week at home. If you do not have a blood pressure cuff at home, one will be provided for you. Contact your provider if you have not received your monitor within 1 week.   Helpful Tips for Accurate Home Blood Pressure Checks  Don't smoke, exercise, or drink caffeine 30 minutes before checking your BP Use the restroom before checking your BP (a full bladder can raise your pressure) Relax in a comfortable upright chair Feet on the ground Left arm resting comfortably on a flat surface at the level of your heart Legs uncrossed Back supported Sit quietly and don't talk Place the cuff on your bare arm Adjust snuggly, so that only two fingertips can fit between your skin and the top of the cuff Check 2 readings separated by at least one minute Keep a log of your BP readings For a visual, please reference this diagram: http://ccnc.care/bpdiagram  Provider Name: [facility]     Phone: [phone]  Zone 1: ALL CLEAR  Continue to monitor your symptoms:  BP reading is less than 140 (top number) or less than 90 (bottom  number)  No right upper stomach pain No headaches or seeing spots No feeling nauseated or throwing up No swelling in face and hands  Zone 2: CAUTION Call your doctor's office for any of the following:  BP reading is greater than 140 (top number) or greater than 90 (bottom number)  Stomach pain under your ribs in the middle or right side Headaches or seeing spots Feeling nauseated or throwing up Swelling in face and hands  Zone 3: EMERGENCY  Seek immediate medical care if you have any of the following:  BP reading is greater than160 (top number) or greater than  110 (bottom number) Severe headaches not improving with Tylenol Serious difficulty catching your breath Any worsening symptoms from Zone 2    First Trimester of Pregnancy The first trimester of pregnancy is from week 1 until the end of week 12 (months 1 through 3). A week after a sperm fertilizes an egg, the egg will implant on the wall of the uterus. This embryo will begin to develop into a baby. Genes from you and your partner are forming the baby. The female genes determine whether the baby is a boy or a girl. At 6-8 weeks, the eyes and face are formed, and the heartbeat can be seen on ultrasound. At the end of 12 weeks, all the baby's organs are formed.  Now that you are pregnant, you will want to do everything you can to have a healthy baby. Two of the most important things are to get good prenatal care and to follow your health care provider's instructions. Prenatal care is all the medical care you receive before the baby's birth. This care will help prevent, find, and treat any problems during the pregnancy and childbirth. BODY CHANGES Your body goes through many changes during pregnancy. The changes vary from woman to woman.  You may gain or lose a couple of pounds at first. You may feel sick to your stomach (nauseous) and throw up (vomit). If the vomiting is uncontrollable, call your health care provider. You may tire easily. You may develop headaches that can be relieved by medicines approved by your health care provider. You may urinate more often. Painful urination may mean you have a bladder infection. You may develop heartburn as a result of your pregnancy. You may develop constipation because certain hormones are causing the muscles that push waste through your intestines to slow down. You may develop hemorrhoids or swollen, bulging veins (varicose veins). Your breasts may begin to grow larger and become tender. Your nipples may stick out more, and the tissue that surrounds them  (areola) may become darker. Your gums may bleed and may be sensitive to brushing and flossing. Dark spots or blotches (chloasma, mask of pregnancy) may develop on your face. This will likely fade after the baby is born. Your menstrual periods will stop. You may have a loss of appetite. You may develop cravings for certain kinds of food. You may have changes in your emotions from day to day, such as being excited to be pregnant or being concerned that something may go wrong with the pregnancy and baby. You may have more vivid and strange dreams. You may have changes in your hair. These can include thickening of your hair, rapid growth, and changes in texture. Some women also have hair loss during or after pregnancy, or hair that feels dry or thin. Your hair will most likely return to normal after your baby is born. WHAT TO EXPECT AT YOUR PRENATAL  VISITS During a routine prenatal visit: You will be weighed to make sure you and the baby are growing normally. Your blood pressure will be taken. Your abdomen will be measured to track your baby's growth. The fetal heartbeat will be listened to starting around week 10 or 12 of your pregnancy. Test results from any previous visits will be discussed. Your health care provider may ask you: How you are feeling. If you are feeling the baby move. If you have had any abnormal symptoms, such as leaking fluid, bleeding, severe headaches, or abdominal cramping. If you have any questions. Other tests that may be performed during your first trimester include: Blood tests to find your blood type and to check for the presence of any previous infections. They will also be used to check for low iron levels (anemia) and Rh antibodies. Later in the pregnancy, blood tests for diabetes will be done along with other tests if problems develop. Urine tests to check for infections, diabetes, or protein in the urine. An ultrasound to confirm the proper growth and development  of the baby. An amniocentesis to check for possible genetic problems. Fetal screens for spina bifida and Down syndrome. You may need other tests to make sure you and the baby are doing well. HOME CARE INSTRUCTIONS  Medicines Follow your health care provider's instructions regarding medicine use. Specific medicines may be either safe or unsafe to take during pregnancy. Take your prenatal vitamins as directed. If you develop constipation, try taking a stool softener if your health care provider approves. Diet Eat regular, well-balanced meals. Choose a variety of foods, such as meat or vegetable-based protein, fish, milk and low-fat dairy products, vegetables, fruits, and whole grain breads and cereals. Your health care provider will help you determine the amount of weight gain that is right for you. Avoid raw meat and uncooked cheese. These carry germs that can cause birth defects in the baby. Eating four or five small meals rather than three large meals a day may help relieve nausea and vomiting. If you start to feel nauseous, eating a few soda crackers can be helpful. Drinking liquids between meals instead of during meals also seems to help nausea and vomiting. If you develop constipation, eat more high-fiber foods, such as fresh vegetables or fruit and whole grains. Drink enough fluids to keep your urine clear or pale yellow. Activity and Exercise Exercise only as directed by your health care provider. Exercising will help you: Control your weight. Stay in shape. Be prepared for labor and delivery. Experiencing pain or cramping in the lower abdomen or low back is a good sign that you should stop exercising. Check with your health care provider before continuing normal exercises. Try to avoid standing for long periods of time. Move your legs often if you must stand in one place for a long time. Avoid heavy lifting. Wear low-heeled shoes, and practice good posture. You may continue to have sex  unless your health care provider directs you otherwise. Relief of Pain or Discomfort Wear a good support bra for breast tenderness.   Take warm sitz baths to soothe any pain or discomfort caused by hemorrhoids. Use hemorrhoid cream if your health care provider approves.   Rest with your legs elevated if you have leg cramps or low back pain. If you develop varicose veins in your legs, wear support hose. Elevate your feet for 15 minutes, 3-4 times a day. Limit salt in your diet. Prenatal Care Schedule your prenatal visits by the  twelfth week of pregnancy. They are usually scheduled monthly at first, then more often in the last 2 months before delivery. Write down your questions. Take them to your prenatal visits. Keep all your prenatal visits as directed by your health care provider. Safety Wear your seat belt at all times when driving. Make a list of emergency phone numbers, including numbers for family, friends, the hospital, and police and fire departments. General Tips Ask your health care provider for a referral to a local prenatal education class. Begin classes no later than at the beginning of month 6 of your pregnancy. Ask for help if you have counseling or nutritional needs during pregnancy. Your health care provider can offer advice or refer you to specialists for help with various needs. Do not use hot tubs, steam rooms, or saunas. Do not douche or use tampons or scented sanitary pads. Do not cross your legs for long periods of time. Avoid cat litter boxes and soil used by cats. These carry germs that can cause birth defects in the baby and possibly loss of the fetus by miscarriage or stillbirth. Avoid all smoking, herbs, alcohol, and medicines not prescribed by your health care provider. Chemicals in these affect the formation and growth of the baby. Schedule a dentist appointment. At home, brush your teeth with a soft toothbrush and be gentle when you floss. SEEK MEDICAL CARE IF:   You have dizziness. You have mild pelvic cramps, pelvic pressure, or nagging pain in the abdominal area. You have persistent nausea, vomiting, or diarrhea. You have a bad smelling vaginal discharge. You have pain with urination. You notice increased swelling in your face, hands, legs, or ankles. SEEK IMMEDIATE MEDICAL CARE IF:  You have a fever. You are leaking fluid from your vagina. You have spotting or bleeding from your vagina. You have severe abdominal cramping or pain. You have rapid weight gain or loss. You vomit blood or material that looks like coffee grounds. You are exposed to [place] measles and have never had them. You are exposed to fifth disease or chickenpox. You develop a severe headache. You have shortness of breath. You have any kind of trauma, such as from a fall or a car accident. Document Released: 12/19/2000 Document Revised: 05/11/2013 Document Reviewed: 11/04/2012 [facility] Patient Information 2015 [place], [place]. This information is not intended to replace advice given to you by your health care provider. Make sure you discuss any questions you have with your health care provider.

## 2020-11-23 NOTE — Progress Notes (Signed)
INITIAL OBSTETRICAL VISIT Patient name: Brandi Mason MRN [id number]  Date of birth: 27-Nov-1990 Chief Complaint:   Initial Prenatal Visit (Hip pain-right thigh "numb"/Using vaginal cream for yeast infection-feeling worse)  History of Present Illness:   Brandi Mason is a 30 y.o. [phone] African-American female at [redacted]w[redacted]d by LMP c/w u/s at 7.3 weeks with an Estimated Date of Delivery: 05/09/21 being seen today for her initial obstetrical visit.   Patient's last menstrual period was 08/02/2020 (exact date). Her obstetrical history is significant for  SVB 2013 with [place] and thought that baby was 36wks, but weighed 7+ lbs .   Today she reports  recent MAU/ED visits for BV, yeast, spotting, and abd pain; now with bilat hip pain and L thigh numbness making it difficult to balance at times; tried vag yeast infection cream and it made her more irritated .  Last pap May 2022. Results were: NILM w/ HRHPV negative  Depression screen [facility] 2/9 11/23/2020 05/11/2020  Decreased Interest 1 1  Down, Depressed, Hopeless 1 0  PHQ - 2 Score 2 1  Altered sleeping 1 3  Tired, decreased energy 1 3  Change in appetite 1 3  Feeling bad or failure about yourself  0 0  Trouble concentrating 1 0  Moving slowly or fidgety/restless 1 0  Suicidal thoughts 0 0  PHQ-9 Score 7 10     GAD 7 : Generalized Anxiety Score 11/23/2020 05/11/2020  Nervous, Anxious, on Edge 1 0  Control/stop worrying 1 0  Worry too much - different things 1 1  Trouble relaxing 1 1  Restless 0 0  Easily annoyed or irritable 1 1  Afraid - awful might happen 1 0  Total GAD 7 Score 6 3     Review of Systems:   Pertinent items are noted in HPI Denies cramping/contractions, leakage of fluid, vaginal bleeding, abnormal vaginal discharge w/ itching/odor/irritation, headaches, visual changes, shortness of breath, chest pain, abdominal pain, severe nausea/vomiting, or problems with urination or bowel movements unless otherwise stated above.  Pertinent  History Reviewed:  Reviewed past medical,surgical, social, obstetrical and family history.  Reviewed problem list, medications and allergies. OB History  Gravida Para Term Preterm AB Living  7 1 1  0 5 1  SAB IAB Ectopic Multiple Live Births  2       1    # Outcome Date GA Lbr Len/2nd Weight Sex Delivery Anes PTL Lv  7 Current           6 Term 12/15/11 [redacted]w[redacted]d  7 lb 3 oz (3.26 kg) F Vag-Spont EPI, Other N LIV     Complications: Chronic hypertension  5 AB 2012          4 AB           3 AB           2 SAB           1 SAB            Physical Assessment:   Vitals:   11/23/20 0907  BP: 127/76  Pulse: 93  Weight: (!) 306 lb (138.8 kg)  Body mass index is 45.19 kg/m.       Physical Examination:  General appearance - well appearing, and in no distress  Mental status - alert, oriented to person, place, and time  Psych:  She has a normal mood and affect  Skin - warm and dry, normal color, no suspicious lesions noted  Chest - effort normal,  all lung fields clear to auscultation bilaterally  Heart - normal rate and regular rhythm  Abdomen - soft, nontender  Extremities:  No swelling or varicosities noted  Pelvic - not indicated  Thin prep pap is not done    TODAY'S NT : too late  Results for orders placed or performed in visit on 11/23/20 (from the past 24 hour(s))  POC Urinalysis Dipstick OB   Collection Time: 11/23/20  9:48 AM  Result Value Ref Range   Color, UA     Clarity, UA     Glucose, UA Negative Negative   Bilirubin, UA     Ketones, UA neg    Spec Grav, UA     Blood, UA neg    pH, UA     POC,PROTEIN,UA Negative Negative, Trace, Small (1+), Moderate (2+), Large (3+), 4+   Urobilinogen, UA     Nitrite, UA neg    Leukocytes, UA Negative Negative   Appearance     Odor      Assessment & Plan:  1) High-Risk Pregnancy [id number] at [redacted]w[redacted]d with an Estimated Date of Delivery: 05/09/21   2) Initial OB visit  3) cHTN, on Labetalol 100mg  bid; will get baseline labs today  and plan for growth q 4wks starting at 20wks; 2x/wk testing at 32wks  4) Msk pain/numbness of hips/R thigh, has already tried belly band, heating pad and muscle rubs; has PCP appt tomorrow and may decide she needs a cane for ambulating more safely  5) Yeast, rx Diflucan as clotrimazole is causing irritation  Meds:  Meds ordered this encounter  Medications   Blood Pressure Monitor MISC    Sig: For regular home bp monitoring during pregnancy    Dispense:  1 each    Refill:  0    O09.91 Please mail to patient Needs large cuff   aspirin 81 MG chewable tablet    Sig: Chew 2 tablets (162 mg total) by mouth daily.    Dispense:  60 tablet    Refill:  7    Order Specific Question:   Supervising Provider    Answer:   H [[id number]]   fluconazole (DIFLUCAN) 150 MG tablet    Sig: Take 1 tablet (150 mg total) by mouth once for 1 dose.    Dispense:  1 tablet    Refill:  0    Order Specific Question:   Supervising Provider    Answer:   Duane Lope H [[id number]]    Initial labs obtained Continue prenatal vitamins Reviewed n/v relief measures and warning s/s to report Reviewed recommended weight gain based on pre-gravid BMI Encouraged well-balanced diet Genetic & carrier screening discussed: too late for NT/IT, requests Panorama, AFP, and Horizon  Ultrasound discussed; fetal survey: requested CCNC completed> form faxed if has or is planning to apply for medicaid The nature of Duane Lope for [employer] with multiple MDs and other Advanced Practice Providers was explained to patient; also emphasized that fellows, residents, and students are part of our team. Does not have home bp cuff. Office bp cuff given: no. Rx sent: yes. Check bp weekly, let [employer] know if consistently >140/90.   Indications for ASA therapy (per uptodate) One of the following: CHTN Yes   Indications for early A1C (per uptodate) BMI >=25 (>=23 in Asian women) AND one of the following HTN or on therapy  for hypertension Yes   Follow-up: Return in about 4 weeks (around 12/21/2020) for HROB, 12/23/2020: Anatomy, in  person.   Orders Placed This Encounter  Procedures   Urine Culture   US OB Comp + 14 Wk   AFP, Serum, Open Spina Bifida   Pain Management Screening Profile (10S)   Protein / creatinine ratio, urine   Genetic Screening   CBC/D/Plt+RPR+Rh+ABO+RubIgG...   Comprehensive metabolic panel   Hemoglobin A1c   POC Urinalysis Dipstick OB    Myrtis Ser CNM 11/23/2020 1:13 PM

## 2020-11-24 ENCOUNTER — Telehealth: Payer: Self-pay | Admitting: Advanced Practice Midwife

## 2020-11-24 NOTE — Telephone Encounter (Signed)
Pt states her PCP won't order a cane Can we order? Please advise & notify pt

## 2020-11-25 ENCOUNTER — Telehealth: Payer: Self-pay | Admitting: Obstetrics & Gynecology

## 2020-11-25 ENCOUNTER — Other Ambulatory Visit: Payer: Self-pay | Admitting: Advanced Practice Midwife

## 2020-11-25 DIAGNOSIS — R262 Difficulty in walking, not elsewhere classified: Secondary | ICD-10-CM

## 2020-11-25 LAB — PMP SCREEN PROFILE (10S), URINE
Amphetamine Scrn, Ur: NEGATIVE ng/mL
BARBITURATE SCREEN URINE: NEGATIVE ng/mL
BENZODIAZEPINE SCREEN, URINE: NEGATIVE ng/mL
CANNABINOIDS UR QL SCN: NEGATIVE ng/mL
Cocaine (Metab) Scrn, Ur: NEGATIVE ng/mL
Creatinine(Crt), U: 261 mg/dL (ref 20.0–300.0)
Methadone Screen, Urine: NEGATIVE ng/mL
OXYCODONE+OXYMORPHONE UR QL SCN: NEGATIVE ng/mL
Opiate Scrn, Ur: NEGATIVE ng/mL
Ph of Urine: 6.8 (ref 4.5–8.9)
Phencyclidine Qn, Ur: NEGATIVE ng/mL
Propoxyphene Scrn, Ur: NEGATIVE ng/mL

## 2020-11-25 LAB — CBC/D/PLT+RPR+RH+ABO+RUBIGG...
Antibody Screen: NEGATIVE
Basophils Absolute: 0 10*3/uL (ref 0.0–0.2)
Basos: 0 %
EOS (ABSOLUTE): 0.1 10*3/uL (ref 0.0–0.4)
Eos: 1 %
HCV Ab: <0.1 s/co ratio (ref 0.0–0.9)
HIV Screen 4th Generation wRfx: NONREACTIVE
Hematocrit: 37.3 % (ref 34.0–46.6)
Hemoglobin: 12.4 g/dL (ref 11.1–15.9)
Hepatitis B Surface Ag: NEGATIVE
Immature Grans (Abs): 0.1 10*3/uL (ref 0.0–0.1)
Immature Granulocytes: 1 %
Lymphocytes Absolute: 2.4 10*3/uL (ref 0.7–3.1)
Lymphs: 30 %
MCH: 27.6 pg (ref 26.6–33.0)
MCHC: 33.2 g/dL (ref 31.5–35.7)
MCV: 83 fL (ref 79–97)
Monocytes Absolute: 0.7 10*3/uL (ref 0.1–0.9)
Monocytes: 8 %
Neutrophils Absolute: 4.8 10*3/uL (ref 1.4–7.0)
Neutrophils: 60 %
Platelets: 279 10*3/uL (ref 150–450)
RBC: 4.5 x10E6/uL (ref 3.77–5.28)
RDW: 12.6 % (ref 11.7–15.4)
RPR Ser Ql: NONREACTIVE
Rh Factor: POSITIVE
Rubella Antibodies, IGG: 1.45 index (ref >0.99)
WBC: 8.1 10*3/uL (ref 3.4–10.8)

## 2020-11-25 LAB — PROTEIN / CREATININE RATIO, URINE
Creatinine, Urine: 246.3 mg/dL
Protein, Ur: 24.7 mg/dL
Protein/Creat Ratio: 100 mg/g creat (ref 0–200)

## 2020-11-25 LAB — AFP, SERUM, OPEN SPINA BIFIDA
AFP MoM: 1.2
AFP Value: 31.4 ng/mL
Gest. Age on Collection Date: 16.1 weeks
Maternal Age At EDD: 29.5 yr
OSBR Risk 1 IN: 10000
Test Results:: NEGATIVE
Weight: 306 [lb_av]

## 2020-11-25 LAB — COMPREHENSIVE METABOLIC PANEL
ALT: 12 IU/L (ref 0–32)
AST: 14 IU/L (ref 0–40)
Albumin/Globulin Ratio: 1.4 (ref 1.2–2.2)
Albumin: 3.8 g/dL — ABNORMAL LOW (ref 3.9–5.0)
Alkaline Phosphatase: 56 IU/L (ref 44–121)
BUN/Creatinine Ratio: 13 (ref 9–23)
BUN: 8 mg/dL (ref 6–20)
Bilirubin Total: <0.2 mg/dL (ref 0.0–1.2)
CO2: 21 mmol/L (ref 20–29)
Calcium: 9.5 mg/dL (ref 8.7–10.2)
Chloride: 103 mmol/L (ref 96–106)
Creatinine, Ser: 0.62 mg/dL (ref 0.57–1.00)
Globulin, Total: 2.8 g/dL (ref 1.5–4.5)
Glucose: 87 mg/dL (ref 70–99)
Potassium: 4.4 mmol/L (ref 3.5–5.2)
Sodium: 139 mmol/L (ref 134–144)
Total Protein: 6.6 g/dL (ref 6.0–8.5)
eGFR: 124 mL/min/{1.73_m2} (ref >59)

## 2020-11-25 LAB — HEMOGLOBIN A1C
Est. average glucose Bld gHb Est-mCnc: 111 mg/dL
Hgb A1c MFr Bld: 5.5 % (ref 4.8–5.6)

## 2020-11-25 LAB — HCV INTERPRETATION

## 2020-11-25 NOTE — Telephone Encounter (Signed)
Returned pt's call to discuss labs. Two identifiers used. Pt's labs were clarified and reviewed with pt. Pt confirmed understanding and will watch for email regarding Avelina Laine genetic testing.

## 2020-11-25 NOTE — Telephone Encounter (Signed)
Patient called about her lab results and wants somebody to explain the results to her.

## 2020-11-28 ENCOUNTER — Telehealth: Payer: Self-pay | Admitting: Advanced Practice Midwife

## 2020-11-28 LAB — URINE CULTURE

## 2020-11-28 NOTE — Telephone Encounter (Signed)
Pt aware order for cane was sent to [facility]. Pt voiced understanding. JSY

## 2020-11-28 NOTE — Telephone Encounter (Signed)
Order for cane faxed to [facility]. JSY

## 2020-11-28 NOTE — Telephone Encounter (Signed)
Patient called stating that [facility] still has not received the order for the cane. Patient is asking for it to be resent.

## 2020-12-06 ENCOUNTER — Encounter: Payer: Self-pay | Admitting: Advanced Practice Midwife

## 2020-12-06 ENCOUNTER — Telehealth: Payer: Self-pay | Admitting: Women's Health

## 2020-12-06 NOTE — Telephone Encounter (Signed)
Pt wants to know if it's ok to take Melatonin while pregnant  Please advise & notify pt

## 2020-12-06 NOTE — Telephone Encounter (Signed)
Returned pt's call after discussing with Joellyn Haff. Two identifiers used. Informed pt it was ok to take Melatonin now that she was out of the 1st trimester. Pt confirmed understanding.

## 2020-12-13 ENCOUNTER — Encounter: Payer: Self-pay | Admitting: Women's Health

## 2020-12-13 DIAGNOSIS — O285 Abnormal chromosomal and genetic finding on antenatal screening of mother: Secondary | ICD-10-CM | POA: Insufficient documentation

## 2020-12-15 ENCOUNTER — Telehealth: Payer: Self-pay | Admitting: *Deleted

## 2020-12-15 NOTE — Telephone Encounter (Signed)
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 I advised to call the number listed in Kim's message and they can answer any questions she has. Pt voiced understanding. JSY

## 2020-12-19 ENCOUNTER — Encounter: Payer: Self-pay | Admitting: Advanced Practice Midwife

## 2020-12-20 ENCOUNTER — Telehealth: Payer: Self-pay | Admitting: *Deleted

## 2020-12-20 NOTE — Telephone Encounter (Signed)
Patient states she fell last night over a table.  She is sore today but no other problems.  She is feeling the baby move. Denies bleeding. Advised patient to continue to monitor and if she notices a change in fetal movement or bleeding, to let us know or go to the hospital.  Verbalized understanding with no further questions.

## 2020-12-21 ENCOUNTER — Encounter: Payer: Self-pay | Admitting: Neurology

## 2020-12-23 ENCOUNTER — Encounter: Payer: Self-pay | Admitting: Obstetrics & Gynecology

## 2020-12-23 ENCOUNTER — Ambulatory Visit (INDEPENDENT_AMBULATORY_CARE_PROVIDER_SITE_OTHER): Payer: Medicaid Other | Admitting: Obstetrics & Gynecology

## 2020-12-23 ENCOUNTER — Other Ambulatory Visit: Payer: Self-pay

## 2020-12-23 ENCOUNTER — Ambulatory Visit (INDEPENDENT_AMBULATORY_CARE_PROVIDER_SITE_OTHER): Payer: Medicaid Other

## 2020-12-23 VITALS — BP 121/81 | HR 102 | Wt 309.5 lb

## 2020-12-23 DIAGNOSIS — O0992 Supervision of high risk pregnancy, unspecified, second trimester: Secondary | ICD-10-CM

## 2020-12-23 DIAGNOSIS — Z363 Encounter for antenatal screening for malformations: Secondary | ICD-10-CM

## 2020-12-23 DIAGNOSIS — O26892 Other specified pregnancy related conditions, second trimester: Secondary | ICD-10-CM

## 2020-12-23 DIAGNOSIS — Z3A20 20 weeks gestation of pregnancy: Secondary | ICD-10-CM | POA: Diagnosis not present

## 2020-12-23 DIAGNOSIS — N898 Other specified noninflammatory disorders of vagina: Secondary | ICD-10-CM

## 2020-12-23 DIAGNOSIS — O10919 Unspecified pre-existing hypertension complicating pregnancy, unspecified trimester: Secondary | ICD-10-CM

## 2020-12-23 LAB — POCT URINALYSIS DIPSTICK OB
Blood, UA: NEGATIVE
Glucose, UA: NEGATIVE
Ketones, UA: NEGATIVE
Nitrite, UA: NEGATIVE
POC,PROTEIN,UA: NEGATIVE

## 2020-12-23 NOTE — Progress Notes (Signed)
[place] 20+3 wks,breech,anterior placenta gr 0,normal ovaries,svp of fluid 5.7 cm,CX 3.9 cm,FHR 123 bpm,EFW 389 g 72%,anatomy complete,no obvious abnormalities

## 2020-12-23 NOTE — Progress Notes (Signed)
HIGH-RISK PREGNANCY VISIT Patient name: Brandi Mason MRN [id number]  Date of birth: May 08, 1990 Chief Complaint:   High Risk Gestation ([place] today; numbness in right hip)  History of Present Illness:   Brandi Mason is a 30 y.o. [phone] female at [redacted]w[redacted]d with an Estimated Date of Delivery: 05/09/21 being seen today for ongoing management of a high-risk pregnancy complicated by:  -CHTN- Labetalol 100mg  daily -mild asthma- occasional use of inhaler  Today she reports  pelvic pressure  and ?irritation.  Notes h/o BV.  Does not think  there is increased discharge. Denies odor or itching.  Contractions: Not present. Vag. Bleeding: None.  Movement: Present. denies leaking of fluid.   Depression screen [facility] 2/9 11/23/2020 05/11/2020  Decreased Interest 1 1  Down, Depressed, Hopeless 1 0  PHQ - 2 Score 2 1  Altered sleeping 1 3  Tired, decreased energy 1 3  Change in appetite 1 3  Feeling bad or failure about yourself  0 0  Trouble concentrating 1 0  Moving slowly or fidgety/restless 1 0  Suicidal thoughts 0 0  PHQ-9 Score 7 10     Current Outpatient Medications  Medication Instructions   albuterol (VENTOLIN HFA) 108 (90 Base) MCG/ACT inhaler Inhalation   aspirin 162 mg, Oral, Daily   Blood Pressure Monitor MISC For regular home bp monitoring during pregnancy   Doxylamine-Pyridoxine (DICLEGIS) 10-10 MG TBEC 2 tabs q hs, if sx persist add 1 tab q am on day 3, if sx persist add 1 tab q afternoon on day 4   labetalol (NORMODYNE) 100 MG tablet Oral   ondansetron (ZOFRAN ODT) 4 mg, Oral, Every 8 hours PRN     Review of Systems:   Pertinent items are noted in HPI Denies headaches, visual changes, shortness of breath, chest pain, abdominal pain, severe nausea/vomiting, or problems with urination or bowel movements unless otherwise stated above. Pertinent History Reviewed:  Reviewed past medical,surgical, social, obstetrical and family history.  Reviewed problem list, medications and  allergies. Physical Assessment:   Vitals:   12/23/20 1004  BP: 121/81  Pulse: (!) 102  Weight: (!) 309 lb 8 oz (140.4 kg)  Body mass index is 45.71 kg/m.           Physical Examination:   General appearance: alert, well appearing, and in no distress  Mental status: normal mood, behavior, speech, dress, motor activity, and thought processes  Skin: warm & dry   Extremities: Edema: Trace    Cardiovascular: normal heart rate noted  Respiratory: normal respiratory effort, no distress  Abdomen: obese, gravid, soft, non-tender  Pelvic:  normal vulvar, white discharge noted, cervix appears closed          Fetal Status:     Movement: Present    Fetal Surveillance Testing today: Anatomy scan- breech,anterior placenta gr 0,normal ovaries,svp of fluid 5.7 cm,CX 3.9 cm,FHR 123 bpm,EFW 389 g 72%,anatomy complete,no obvious abnormalities    Chaperone: 12/25/20    Results for orders placed or performed in visit on 12/23/20 (from the past 24 hour(s))  POC Urinalysis Dipstick OB   Collection Time: 12/23/20 10:06 AM  Result Value Ref Range   Color, UA     Clarity, UA     Glucose, UA Negative Negative   Bilirubin, UA     Ketones, UA neg    Spec Grav, UA     Blood, UA neg    pH, UA     POC,PROTEIN,UA Negative Negative, Trace, Small (1+), Moderate (2+), Large (  3+), 4+   Urobilinogen, UA     Nitrite, UA neg    Leukocytes, UA Trace (A) Negative   Appearance     Odor       Assessment & Plan:  High-risk pregnancy: [id number] at [redacted]w[redacted]d with an Estimated Date of Delivery: 05/09/21   1)cHTN- continue current medication -plan to continue growth q 4wks []  will need to schedule antepartum testing in the future -pt given BP cuff from office  2) Contraceptive management -desires tubal ligation- risk/benefit reviewed -discussed that based on BMI, may be better candidate for interval 6wk PP tubal -plan to complete paperwork today  3) Vaginal discharge/pelvic pressure -no abnormalities noted on  exam -wet mount negative  Meds: No orders of the defined types were placed in this encounter.   Labs/procedures today: anatomy scan- wnl  Treatment Plan:  as outlined above  Reviewed: Preterm labor symptoms and general obstetric precautions including but not limited to vaginal bleeding, contractions, leaking of fluid and fetal movement were reviewed in detail with the patient.  All questions were answered. Pt given BP cuff. Check bp weekly, let us know if >140/90.   Follow-up: Return in about 4 weeks (around 01/20/2021) for [place] visit with growth, please schedule growth every 4wk, []  tubal paperwork.   Future Appointments  Date Time Provider [place]  03/06/2021  9:50 AM Alda Berthold, DO [facility] None    Orders Placed This Encounter  Procedures   POC Urinalysis Dipstick OB    Janyth Pupa, DO Attending [place], [facility] for [employer], [place]

## 2021-01-03 ENCOUNTER — Encounter: Payer: Self-pay | Admitting: Advanced Practice Midwife

## 2021-01-19 ENCOUNTER — Other Ambulatory Visit: Payer: Self-pay | Admitting: Obstetrics & Gynecology

## 2021-01-19 DIAGNOSIS — O10919 Unspecified pre-existing hypertension complicating pregnancy, unspecified trimester: Secondary | ICD-10-CM

## 2021-01-20 ENCOUNTER — Other Ambulatory Visit: Payer: Medicaid Other

## 2021-01-20 ENCOUNTER — Encounter: Payer: Medicaid Other | Admitting: Obstetrics & Gynecology

## 2021-01-31 ENCOUNTER — Other Ambulatory Visit: Payer: Medicaid Other

## 2021-02-28 ENCOUNTER — Telehealth: Payer: Self-pay

## 2021-03-06 ENCOUNTER — Encounter: Payer: Self-pay | Admitting: Neurology

## 2021-03-06 ENCOUNTER — Ambulatory Visit: Payer: Medicaid Other | Admitting: Neurology

## 2022-09-18 IMAGING — CT CT HEAD W/O CM
3 series · 15 of 47 positions shown, 18 images · non-contrast
Comparison: None.

CLINICAL DATA: Headache

EXAM:
CT HEAD WITHOUT CONTRAST
TECHNIQUE: Contiguous axial images were obtained from the base of the skull
through the vertex without intravenous contrast.

[Series 2: head w o · axial · 0.50mm/px · z∈[+46,+171]mm · 9 of 31 slices shown, 12 images]
[im 3/31  brain]
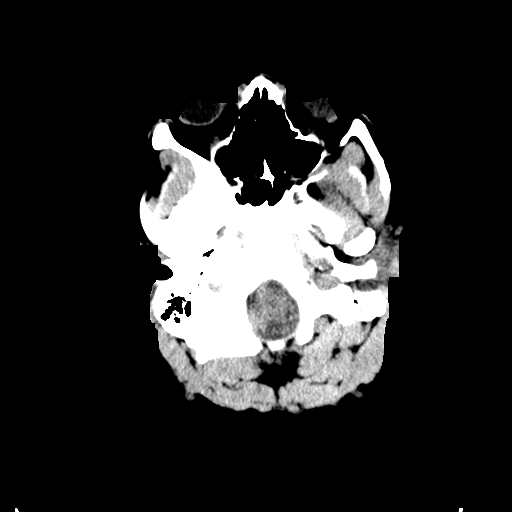
[im 3/31  bone]
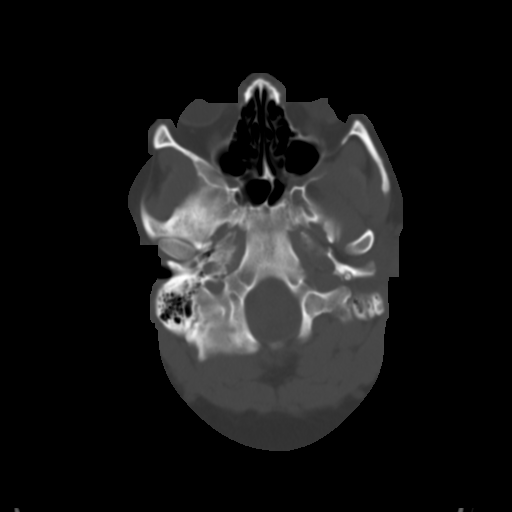
[im 6/31  brain]
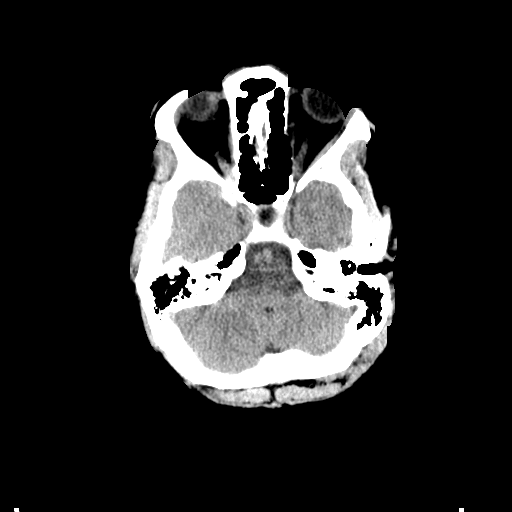
[im 9/31  brain]
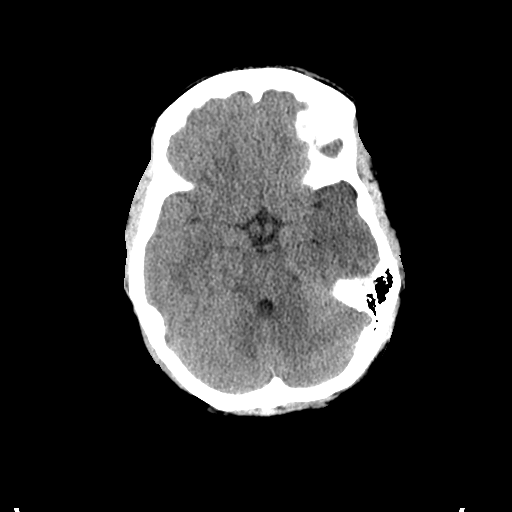
[im 12/31  brain]
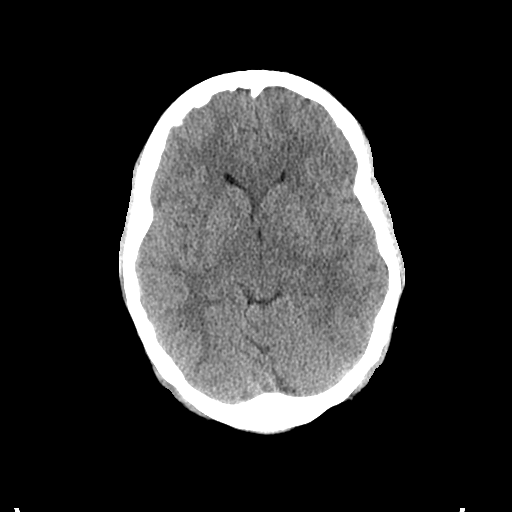
[im 16/31  brain]
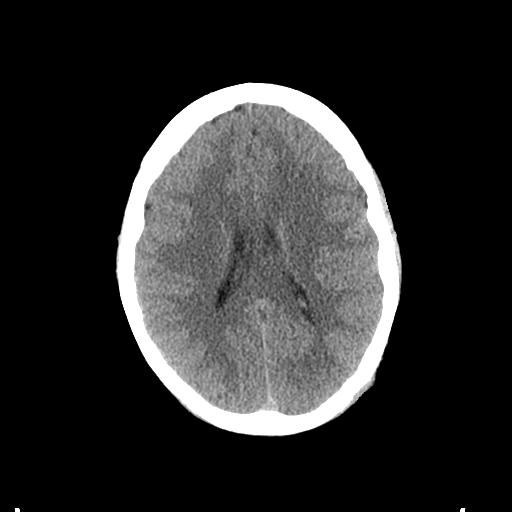
[im 16/31  bone]
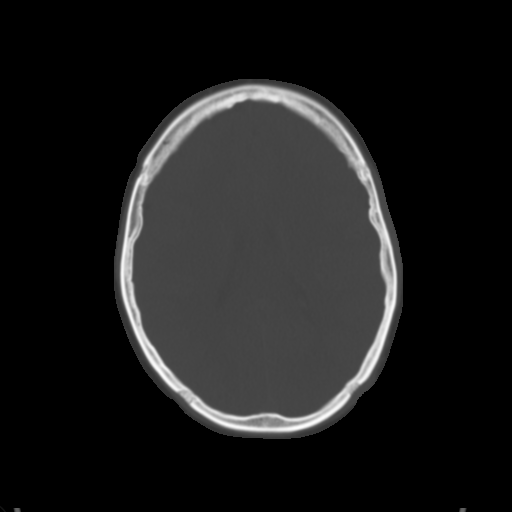
[im 19/31  brain]
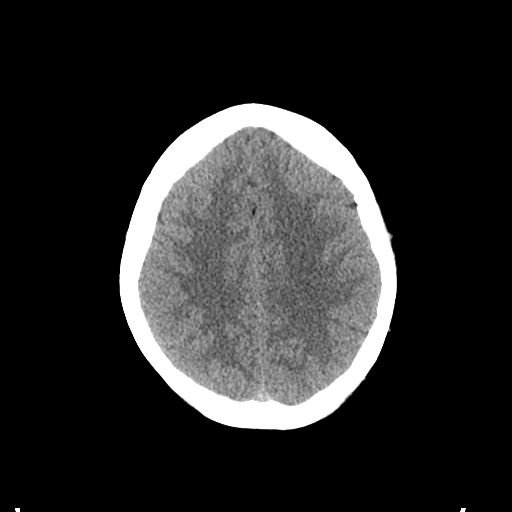
[im 22/31  brain]
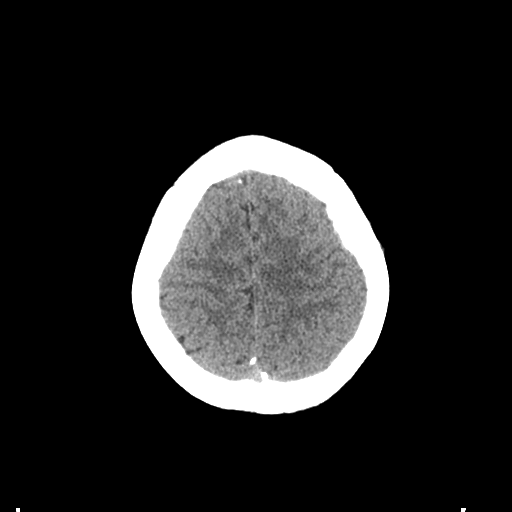
[im 25/31  brain]
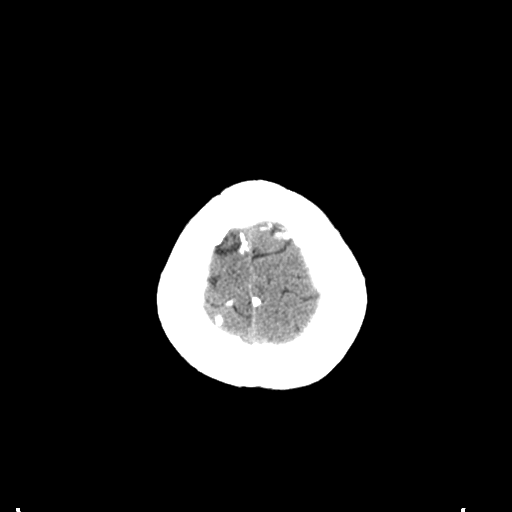
[im 28/31  brain]
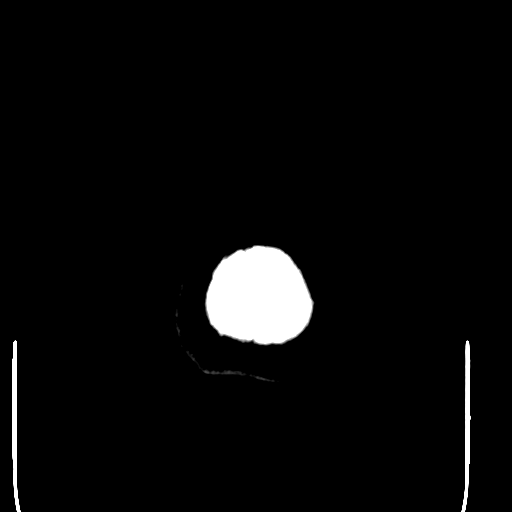
[im 28/31  bone]
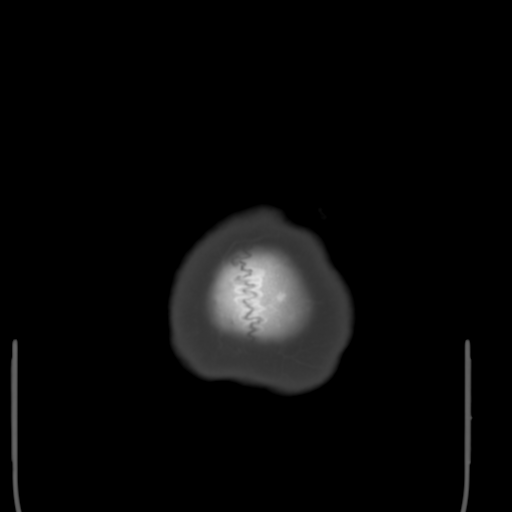

[Series 4: coronal soft · coronal · 0.33mm/px · 3 of 69 slices shown]
[im 23/69  brain]
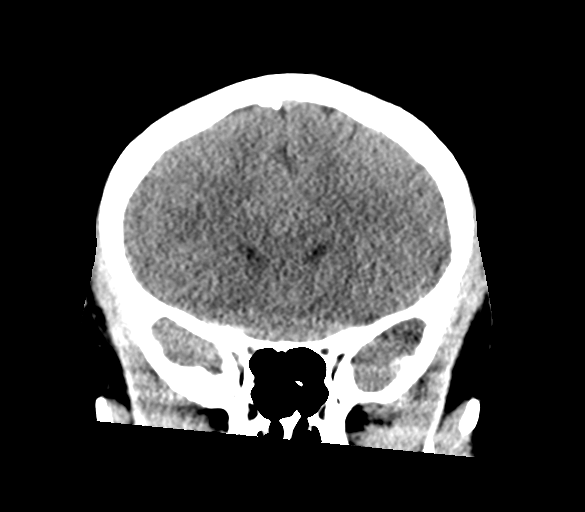
[im 31/69  brain]
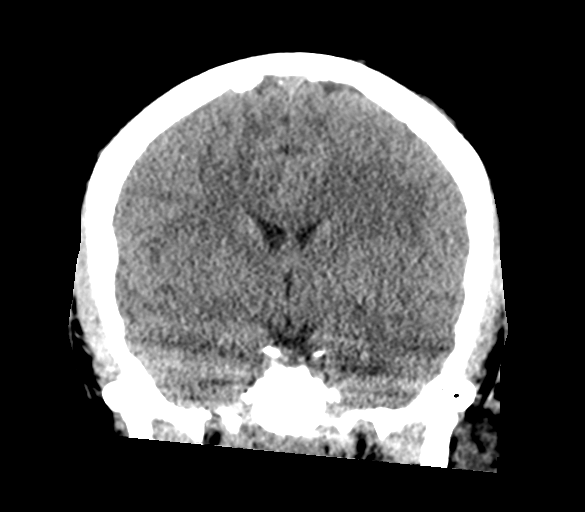
[im 38/69  brain]
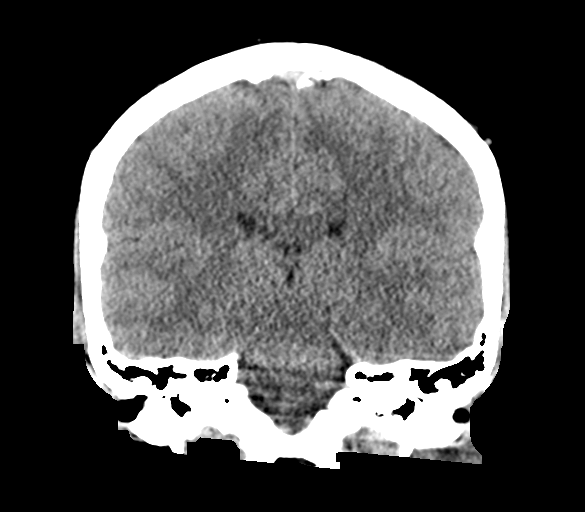

[Series 5: sagittal soft · sagittal · 0.31mm/px · 3 of 58 slices shown]
[im 20/58  brain]
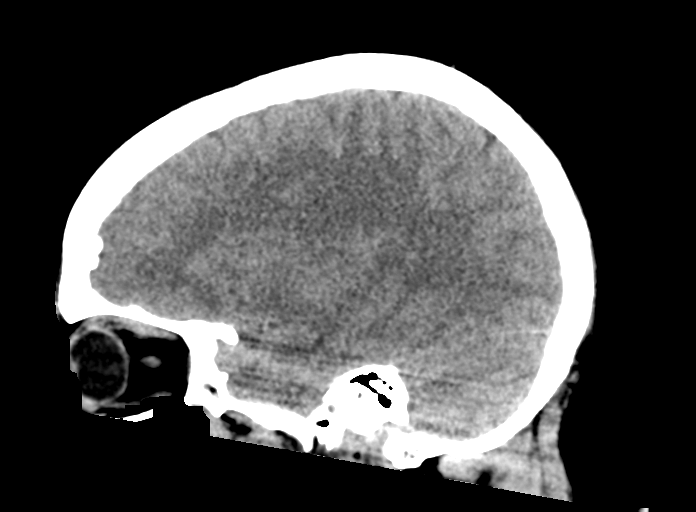
[im 29/58  brain]
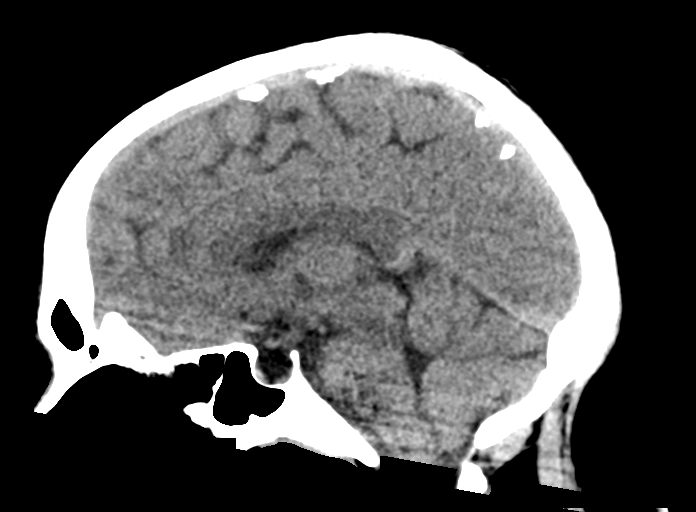
[im 39/58  brain]
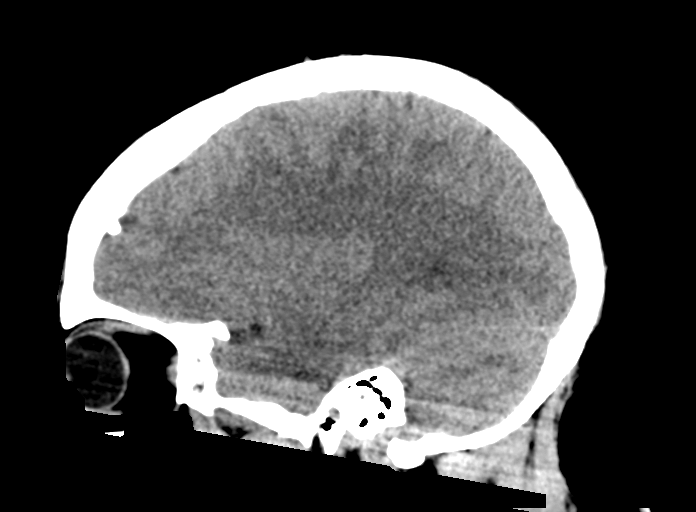

[15 of 47 positions shown; findings below may reference images not displayed]

FINDINGS: Brain: No evidence of acute infarction, hemorrhage, hydrocephalus,
extra-axial collection or mass lesion/mass effect. Multifocal dural
calcifications.

Vascular: No hyperdense vessel or unexpected calcification.

Skull: Normal. Negative for fracture or focal lesion.

Sinuses/Orbits: No acute finding.

Other: None.
IMPRESSION: Negative head CT.
